# Patient Record
Sex: Female | Born: 1975
Health system: Southern US, Community
[De-identification: ages and names within clinical notes are randomized; demographics above are authoritative.]

## PROBLEM LIST (undated history)

## (undated) DIAGNOSIS — M797 Fibromyalgia: Secondary | ICD-10-CM

## (undated) DIAGNOSIS — Z8739 Personal history of other diseases of the musculoskeletal system and connective tissue: Secondary | ICD-10-CM

## (undated) DIAGNOSIS — Z86018 Personal history of other benign neoplasm: Secondary | ICD-10-CM

## (undated) DIAGNOSIS — M1990 Unspecified osteoarthritis, unspecified site: Secondary | ICD-10-CM

## (undated) DIAGNOSIS — M7752 Other enthesopathy of left foot: Secondary | ICD-10-CM

## (undated) DIAGNOSIS — M349 Systemic sclerosis, unspecified: Secondary | ICD-10-CM

## (undated) DIAGNOSIS — J45909 Unspecified asthma, uncomplicated: Secondary | ICD-10-CM

## (undated) HISTORY — DX: Personal history of other diseases of the musculoskeletal system and connective tissue: Z87.39

## (undated) HISTORY — PX: KNEE SURGERY: SHX244

## (undated) HISTORY — PX: TUBAL LIGATION: SHX77

## (undated) HISTORY — DX: Personal history of other benign neoplasm: Z86.018

## (undated) HISTORY — DX: Unspecified asthma, uncomplicated: J45.909

## (undated) HISTORY — DX: Other enthesopathy of left foot and ankle: M77.52

## (undated) HISTORY — PX: NASAL SEPTUM SURGERY: SHX37

## (undated) HISTORY — PX: MYOMECTOMY: SHX85

## (undated) HISTORY — PX: CARPAL TUNNEL RELEASE: SHX101

## (undated) HISTORY — DX: Unspecified osteoarthritis, unspecified site: M19.90

## (undated) HISTORY — DX: Systemic sclerosis, unspecified: M34.9

## (undated) HISTORY — DX: Fibromyalgia: M79.7

---

## 2001-09-17 HISTORY — PX: ENDOMETRIAL ABLATION: SHX621

## 2010-09-17 HISTORY — PX: LAPAROSCOPIC SUPRACERVICAL HYSTERECTOMY: SUR797

## 2019-07-06 ENCOUNTER — Other Ambulatory Visit: Payer: Self-pay

## 2019-07-06 ENCOUNTER — Emergency Department (HOSPITAL_COMMUNITY)
Admission: EM | Admit: 2019-07-06 | Discharge: 2019-07-06 | Disposition: A | Discharge status: Home / Self Care | Payer: Medicare Other | Attending: Emergency Medicine | Admitting: Emergency Medicine

## 2019-07-06 ENCOUNTER — Encounter (HOSPITAL_COMMUNITY): Payer: Self-pay | Admitting: Emergency Medicine

## 2019-07-06 ENCOUNTER — Emergency Department (HOSPITAL_COMMUNITY): Payer: Medicare Other

## 2019-07-06 DIAGNOSIS — R0789 Other chest pain: Secondary | ICD-10-CM | POA: Diagnosis not present

## 2019-07-06 DIAGNOSIS — R079 Chest pain, unspecified: Secondary | ICD-10-CM | POA: Diagnosis present

## 2019-07-06 DIAGNOSIS — Z79899 Other long term (current) drug therapy: Secondary | ICD-10-CM | POA: Diagnosis not present

## 2019-07-06 LAB — TROPONIN I (HIGH SENSITIVITY)
Troponin I (High Sensitivity): <2 ng/L (ref <18)
Troponin I (High Sensitivity): <2 ng/L (ref <18)

## 2019-07-06 LAB — CBC
HCT: 44.8 % (ref 36.0–46.0)
Hemoglobin: 14.4 g/dL (ref 12.0–15.0)
MCH: 24.8 pg — ABNORMAL LOW (ref 26.0–34.0)
MCHC: 32.1 g/dL (ref 30.0–36.0)
MCV: 77.1 fL — ABNORMAL LOW (ref 80.0–100.0)
Platelets: 278 10*3/uL (ref 150–400)
RBC: 5.81 MIL/uL — ABNORMAL HIGH (ref 3.87–5.11)
RDW: 13.2 % (ref 11.5–15.5)
WBC: 5.3 10*3/uL (ref 4.0–10.5)
nRBC: 0 % (ref 0.0–0.2)

## 2019-07-06 LAB — BASIC METABOLIC PANEL
Anion gap: 8 (ref 5–15)
BUN: 5 mg/dL — ABNORMAL LOW (ref 6–20)
CO2: 26 mmol/L (ref 22–32)
Calcium: 9.2 mg/dL (ref 8.9–10.3)
Chloride: 103 mmol/L (ref 98–111)
Creatinine, Ser: 0.95 mg/dL (ref 0.44–1.00)
GFR calc Af Amer: >60 mL/min (ref >60)
GFR calc non Af Amer: >60 mL/min (ref >60)
Glucose, Bld: 95 mg/dL (ref 70–99)
Potassium: 4.2 mmol/L (ref 3.5–5.1)
Sodium: 137 mmol/L (ref 135–145)

## 2019-07-06 LAB — I-STAT BETA HCG BLOOD, ED (MC, WL, AP ONLY): I-stat hCG, quantitative: <5 m[IU]/mL (ref <5)

## 2019-07-06 NOTE — ED Provider Notes (Signed)
[facility] EMERGENCY DEPARTMENT Provider Note   CSN: [id number] Arrival date & time: 07/06/19  1201     History   Chief Complaint Chief Complaint  Patient presents with  . Chest Pain    HPI Sheri May is a 43 y.o. female.     HPI Patient presents for concern of chest pain, described as tightness, left inferior breast wrapping to the left side. No dyspnea, cough, fever, chills, nausea, vomiting. She does have some similar episodes in the past, though none quite the same. No clear alleviating or exacerbating factors. No syncope. Notably, the patient has no history of cardiac disease.  She does have history of depression, and notes that this time of year she often has episodes, as it is the anniversary of child's death, 23 years ago.   OB History   No obstetric history on file.    Past medical history: Allergies Arthritis   Home Medications    Prior to Admission medications   Medication Sig Start Date End Date Taking? Authorizing Provider  aspirin-acetaminophen-caffeine (EXCEDRIN MIGRAINE) [phone] MG tablet Take 2 tablets by mouth every 6 (six) hours as needed for headache or migraine.   Yes [provider]  budesonide-formoterol (SYMBICORT) 160-4.5 MCG/ACT inhaler Inhale 2 puffs into the lungs 2 (two) times daily.   Yes [provider]  Chlorphen-Pseudoephed-APAP (TYLENOL ALLERGY SINUS PO) Take 2 tablets by mouth every 6 (six) hours as needed (congestion).   Yes [provider]  cyclobenzaprine (FLEXERIL) 5 MG tablet Take 5 mg by mouth 2 (two) times daily as needed for muscle spasms.   Yes [provider]  folic acid (FOLVITE) 1 MG tablet Take 1 mg by mouth every morning.   Yes [provider]  ketotifen (ZADITOR) 0.025 % ophthalmic solution Place 1 drop into both eyes 2 (two) times daily.   Yes [provider]  levalbuterol (XOPENEX HFA) 45 MCG/ACT inhaler Inhale 2 puffs into the lungs every  8 (eight) hours as needed for wheezing or shortness of breath.   Yes [provider]  levocetirizine (XYZAL) 5 MG tablet Take 5 mg by mouth at bedtime.   Yes [provider]  methotrexate (RHEUMATREX) 2.5 MG tablet Take 17.5 mg by mouth every Sunday. 7 tablets - 17.5 mg   Yes [provider]  montelukast (SINGULAIR) 10 MG tablet Take 10 mg by mouth at bedtime.   Yes [provider]  Multiple Vitamin (MULTIVITAMIN WITH MINERALS) TABS tablet Take 1 tablet by mouth every morning.   Yes [provider]  naproxen (NAPROSYN) 500 MG tablet Take 500 mg by mouth 2 (two) times daily as needed (pain).   Yes [provider]  omalizumab Geoffry Paradise) 150 MG/ML prefilled syringe Inject 375 mg into the skin every 14 (fourteen) days.   Yes [provider]    Family History History reviewed. No pertinent family history.  Social History Social History   Tobacco Use  . Smoking status: Not on file  Substance Use Topics  . Alcohol use: Not on file  . Drug use: Not on file     Allergies   Patient has no known allergies.   Review of Systems Review of Systems  Constitutional:       Per HPI, otherwise negative  HENT:       Per HPI, otherwise negative  Respiratory:       Per HPI, otherwise negative  Cardiovascular:       Per HPI, otherwise negative  Gastrointestinal:  Negative for vomiting.  Endocrine:       Negative aside from HPI  Genitourinary:       Neg aside from HPI   Musculoskeletal:       Per HPI, otherwise negative  Skin: Negative.   Neurological: Negative for syncope.  Psychiatric/Behavioral: Positive for dysphoric mood. The patient is nervous/anxious.      Physical Exam Updated Vital Signs BP 113/74   Pulse 68   Temp 98.7 F (37.1 C) (Oral)   Resp 16   SpO2 100%   Physical Exam Vitals signs and nursing note reviewed.  Constitutional:      General: She is not in acute distress.    Appearance: She is  well-developed.  HENT:     Head: Normocephalic and atraumatic.  Eyes:     Conjunctiva/sclera: Conjunctivae normal.  Cardiovascular:     Rate and Rhythm: Normal rate and regular rhythm.  Pulmonary:     Effort: Pulmonary effort is normal. No respiratory distress.     Breath sounds: Normal breath sounds. No stridor.  Abdominal:     General: There is no distension.  Skin:    General: Skin is warm and dry.  Neurological:     Mental Status: She is alert and oriented to person, place, and time.     Cranial Nerves: No cranial nerve deficit.      ED Treatments / Results  Labs (all labs ordered are listed, but only abnormal results are displayed) Labs Reviewed  BASIC METABOLIC PANEL - Abnormal; Notable for the following components:      Result Value   BUN 5 (*)    All other components within normal limits  CBC - Abnormal; Notable for the following components:   RBC 5.81 (*)    MCV 77.1 (*)    MCH 24.8 (*)    All other components within normal limits  I-STAT BETA HCG BLOOD, ED (MC, WL, AP ONLY)  TROPONIN I (HIGH SENSITIVITY)  TROPONIN I (HIGH SENSITIVITY)    EKG EKG Interpretation  Date/Time:  Monday July 06 2019 12:07:52 EDT Ventricular Rate:  93 PR Interval:  136 QRS Duration: 76 QT Interval:  370 QTC Calculation: 460 R Axis:   85 Text Interpretation:  Normal sinus rhythm with sinus arrhythmia Artifact Abnormal ECG Confirmed by Gerhard MunchLockwood, Doreen Garretson [phone]([id number]) on 07/06/2019 12:51:24 PM   Radiology Dg Chest 2 View  Result Date: 07/06/2019 CLINICAL DATA:  Chest pain for 1 week EXAM: CHEST - 2 VIEW COMPARISON:  None. FINDINGS: The heart size and mediastinal contours are within normal limits. Both lungs are clear. The visualized skeletal structures are unremarkable. IMPRESSION: No active cardiopulmonary disease. Electronically Signed   By: Alcide CleverMark  Lukens M.D.   On: 07/06/2019 12:42    Procedures Procedures (including critical care time)  Medications Ordered in ED Medications  - No data to display   Initial Impression / Assessment and Plan / ED Course  I have reviewed the triage vital signs and the nursing notes.  Pertinent labs & imaging results that were available during my care of the patient were reviewed by me and considered in my medical decision making (see chart for details).        3:33 PM Patient in no distress, awake, alert.  We discussed all findings, which were reassuring. No evidence for ACS, pneumonia, PE with reassuring vitals, labs, x-ray. We discussed necessity of follow-up, and the patient notes that she just moved to this area.  She has been provided referral to outpatient  providers.  Covid test pending.   Daisa Stennis was evaluated in Emergency Department on 07/06/2019 for the symptoms described in the history of present illness. She was evaluated in the context of the global COVID-19 pandemic, which necessitated consideration that the patient might be at risk for infection with the SARS-CoV-2 virus that causes COVID-19. Institutional protocols and algorithms that pertain to the evaluation of patients at risk for COVID-19 are in a state of rapid change based on information released by regulatory bodies including the CDC and federal and state organizations. These policies and algorithms were followed during the patient's care in the ED.   Final Clinical Impressions(s) / ED Diagnoses   Final diagnoses:  Atypical chest pain     Carmin Muskrat, MD 07/06/19 1534

## 2019-07-06 NOTE — Discharge Instructions (Addendum)
As discussed, your evaluation today has been largely reassuring.  But, it is important that you monitor your condition carefully, and do not hesitate to return to the ED if you develop new, or concerning changes in your condition. ? ?Otherwise, please follow-up with your physician for appropriate ongoing care. ? ?

## 2019-07-06 NOTE — ED Triage Notes (Signed)
Pt states she has been having chest pain that started last week. Pt thought it was a depressive episode due to the anniversary of her child's death, but it has persisted. Endorses dizziness, weakness, loss of appetite. Pt took tylenol and muscle relaxer's with minimal relief.

## 2019-07-19 DIAGNOSIS — E559 Vitamin D deficiency, unspecified: Secondary | ICD-10-CM

## 2019-07-19 DIAGNOSIS — E785 Hyperlipidemia, unspecified: Secondary | ICD-10-CM

## 2019-07-19 DIAGNOSIS — K219 Gastro-esophageal reflux disease without esophagitis: Secondary | ICD-10-CM

## 2019-07-19 HISTORY — DX: Hyperlipidemia, unspecified: E78.5

## 2019-07-19 HISTORY — DX: Vitamin D deficiency, unspecified: E55.9

## 2019-07-19 HISTORY — DX: Gastro-esophageal reflux disease without esophagitis: K21.9

## 2019-08-03 ENCOUNTER — Other Ambulatory Visit: Payer: Self-pay

## 2019-08-03 ENCOUNTER — Encounter: Payer: Self-pay | Admitting: Family Medicine

## 2019-08-03 ENCOUNTER — Ambulatory Visit (INDEPENDENT_AMBULATORY_CARE_PROVIDER_SITE_OTHER): Payer: Medicare Other | Admitting: Family Medicine

## 2019-08-03 VITALS — BP 127/73 | HR 72 | Temp 98.2°F | Ht 62.4 in | Wt 207.0 lb

## 2019-08-03 DIAGNOSIS — K2100 Gastro-esophageal reflux disease with esophagitis, without bleeding: Secondary | ICD-10-CM

## 2019-08-03 DIAGNOSIS — Z5900 Homelessness unspecified: Secondary | ICD-10-CM

## 2019-08-03 DIAGNOSIS — E559 Vitamin D deficiency, unspecified: Secondary | ICD-10-CM

## 2019-08-03 DIAGNOSIS — Z23 Encounter for immunization: Secondary | ICD-10-CM

## 2019-08-03 DIAGNOSIS — Z7689 Persons encountering health services in other specified circumstances: Secondary | ICD-10-CM

## 2019-08-03 DIAGNOSIS — E538 Deficiency of other specified B group vitamins: Secondary | ICD-10-CM

## 2019-08-03 DIAGNOSIS — R0602 Shortness of breath: Secondary | ICD-10-CM

## 2019-08-03 DIAGNOSIS — Z0000 Encounter for general adult medical examination without abnormal findings: Secondary | ICD-10-CM | POA: Diagnosis not present

## 2019-08-03 DIAGNOSIS — R809 Proteinuria, unspecified: Secondary | ICD-10-CM

## 2019-08-03 DIAGNOSIS — Z09 Encounter for follow-up examination after completed treatment for conditions other than malignant neoplasm: Secondary | ICD-10-CM

## 2019-08-03 DIAGNOSIS — Z6837 Body mass index (BMI) 37.0-37.9, adult: Secondary | ICD-10-CM

## 2019-08-03 DIAGNOSIS — E66812 Obesity, class 2: Secondary | ICD-10-CM

## 2019-08-03 DIAGNOSIS — R0789 Other chest pain: Secondary | ICD-10-CM

## 2019-08-03 DIAGNOSIS — R1084 Generalized abdominal pain: Secondary | ICD-10-CM

## 2019-08-03 LAB — POCT URINALYSIS DIPSTICK
Blood, UA: NEGATIVE
Glucose, UA: NEGATIVE
Ketones, UA: NEGATIVE
Leukocytes, UA: NEGATIVE
Nitrite, UA: NEGATIVE
Protein, UA: POSITIVE — AB
Spec Grav, UA: >=1.03 — AB (ref 1.010–1.025)
Urobilinogen, UA: 0.2 E.U./dL
pH, UA: 5.5 (ref 5.0–8.0)

## 2019-08-03 MED ORDER — NAPROXEN 500 MG PO TABS: 500.0000 mg | ORAL_TABLET | Freq: Two times a day (BID) | ORAL | 3 refills | Status: DC | PRN

## 2019-08-03 MED ORDER — CETIRIZINE HCL 10 MG PO TABS: 10.0000 mg | ORAL_TABLET | Freq: Every day | ORAL | 11 refills | Status: DC

## 2019-08-03 MED ORDER — LEVALBUTEROL TARTRATE 45 MCG/ACT IN AERO: 2.0000 | INHALATION_SPRAY | Freq: Three times a day (TID) | RESPIRATORY_TRACT | 11 refills | Status: DC | PRN

## 2019-08-03 NOTE — Progress Notes (Signed)
[facility]   New Patient--Hospital Follow Up--Establish Care  Subjective:  Patient ID: Sheri May, female    DOB: [birth year]  Age: 43 y.o. MRN: [id number]  CC:  Chief Complaint  Patient presents with  . Hospitalization Follow-up    Seen ED 07/06/2019 Atypical Chest Pain  . Establish Care  . Abdominal Pain    on & off pain under rib across abdomen -     HPI Sheri May is a 43 year old female who presents for Hospital Follow Up and to Establish Care today.   Past Medical History:  Diagnosis Date  . Arthritis   . Asthma   . Fibromyalgia   . H/O scleroderma   . H/O: hysterectomy 2012   "partial"  . History of uterine fibroid    removal 2013  . History of vitamin D deficiency   . Systemic sclerosis (HCC)    Current Status: This will be Ms.Cappuccio's initial office visit with me. She was previously seeing physician in [place], [place] for her PCP needs. Since her last office visit, she has had an ED visit for Atypical Chest Pain on 07/06/2019. Today, she is doing well with no complaints. She reports upper-mid chest discomfort intermittently. Denies chest pain, heart palpitations, cough and shortness of breath reported. She reports occasional nausea. She reports occasional abdominal pain, which she has a history of Fibroid removal. No reports of GI problems such as vomiting, diarrhea, and constipation.    Her anxiety is mild today. She denies suicidal ideations, homicidal ideations, or auditory hallucinations.   She reports that she is currently homeless and does not have a permanent place to live at this time.    She denies fevers, chills, fatigue, recent infections, weight loss, and night sweats. She has not had any headaches, visual changes, dizziness, and falls. She has no reports of blood in stools, dysuria and hematuria. She denies pain today.    Past Surgical History:  Procedure Laterality Date  . ABLATION     uterine   . CARPAL TUNNEL RELEASE Bilateral   . CESAREAN SECTION  1997  . KNEE SURGERY Left    plate in left knee  . MYOMECTOMY    . NASAL SEPTUM SURGERY      Family History  Problem Relation Age of Onset  . Hypertension Mother   . Fibromyalgia Mother   . Hypertension Father   . Multiple sclerosis Brother     Social History   Socioeconomic History  . Marital status: Single    Spouse name: Not on file  . Number of children: Not on file  . Years of education: Not on file  . Highest education level: Not on file  Occupational History  . Not on file  Social Needs  . Financial resource strain: Not on file  . Food insecurity    Worry: Not on file    Inability: Not on file  . Transportation needs    Medical: Not on file    Non-medical: Not on file  Tobacco Use  . Smoking status: Light Tobacco Smoker  . Smokeless tobacco: Never Used  Substance and Sexual Activity  . Alcohol use: Not Currently  . Drug use: Not Currently  . Sexual activity: Yes    Partners: Male    Birth control/protection: Condom  Lifestyle  . Physical activity    Days per week: Not on file    Minutes per session: Not on file  . Stress: Not on  file  Relationships  . Social Musicianconnections    Talks on phone: Not on file    Gets together: Not on file    Attends religious service: Not on file    Active member of club or organization: Not on file    Attends meetings of clubs or organizations: Not on file    Relationship status: Not on file  . Intimate partner violence    Fear of current or ex partner: Not on file    Emotionally abused: Not on file    Physically abused: Not on file    Forced sexual activity: Not on file  Other Topics Concern  . Not on file  Social History Narrative  . Not on file    Outpatient Medications Prior to Visit  Medication Sig Dispense Refill  . aspirin-acetaminophen-caffeine (EXCEDRIN MIGRAINE) 250-250-65 MG tablet Take 2 tablets by mouth every 6 (six) hours as needed for headache  or migraine.    . budesonide-formoterol (SYMBICORT) 160-4.5 MCG/ACT inhaler Inhale 2 puffs into the lungs 2 (two) times daily.    . Chlorphen-Pseudoephed-APAP (TYLENOL ALLERGY SINUS PO) Take 2 tablets by mouth every 6 (six) hours as needed (congestion).    . folic acid (FOLVITE) 1 MG tablet Take 1 mg by mouth every morning.    Marland Kitchen. ketotifen (ZADITOR) 0.025 % ophthalmic solution Place 1 drop into both eyes 2 (two) times daily.    . montelukast (SINGULAIR) 10 MG tablet Take 10 mg by mouth at bedtime.    . Multiple Vitamin (MULTIVITAMIN WITH MINERALS) TABS tablet Take 1 tablet by mouth every morning.    Marland Kitchen. omalizumab Geoffry Paradise(XOLAIR) 150 MG/ML prefilled syringe Inject 375 mg into the skin every 14 (fourteen) days.    . naproxen (NAPROSYN) 500 MG tablet Take 500 mg by mouth 2 (two) times daily as needed (pain).    . cyclobenzaprine (FLEXERIL) 5 MG tablet Take 5 mg by mouth 2 (two) times daily as needed for muscle spasms.    . methotrexate (RHEUMATREX) 2.5 MG tablet Take 17.5 mg by mouth every Sunday. 7 tablets - 17.5 mg    . levalbuterol (XOPENEX HFA) 45 MCG/ACT inhaler Inhale 2 puffs into the lungs every 8 (eight) hours as needed for wheezing or shortness of breath.    . levocetirizine (XYZAL) 5 MG tablet Take 5 mg by mouth at bedtime.     No facility-administered medications prior to visit.     No Known Allergies  ROS Review of Systems  Constitutional: Negative.   HENT: Negative.   Eyes: Negative.   Respiratory: Negative.   Cardiovascular: Negative.   Gastrointestinal: Positive for abdominal distention and abdominal pain (generalized).  Endocrine: Negative.   Genitourinary: Negative.   Musculoskeletal: Positive for arthralgias (gneralized).  Skin: Negative.   Allergic/Immunologic: Negative.   Neurological: Positive for dizziness (occasional ) and headaches (Occasional ).  Hematological: Negative.   Psychiatric/Behavioral: Negative.       Objective:    Physical Exam  Constitutional: She is  oriented to person, place, and time. She appears well-developed and well-nourished.  HENT:  Head: Normocephalic and atraumatic.  Eyes: Conjunctivae are normal.  Neck: Normal range of motion. Neck supple.  Cardiovascular: Normal rate, regular rhythm, normal heart sounds and intact distal pulses.  Pulmonary/Chest: Effort normal and breath sounds normal.  Abdominal: Soft. Bowel sounds are normal.  Musculoskeletal: Normal range of motion.  Neurological: She is alert and oriented to person, place, and time. She has normal reflexes.  Skin: Skin is warm and dry.  Psychiatric: She has a normal mood and affect. Her behavior is normal. Judgment and thought content normal.  Nursing note and vitals reviewed.   BP 127/73   Pulse 72   Temp 98.2 F (36.8 C)   Ht 5' 2.4" (1.585 m)   Wt 207 lb (93.9 kg)   SpO2 98%   BMI 37.38 kg/m  Wt Readings from Last 3 Encounters:  08/03/19 207 lb (93.9 kg)     Health Maintenance Due  Topic Date Due  . HIV Screening  12/24/1991  . TETANUS/TDAP  12/24/1995  . PAP SMEAR-Modifier  12/23/1997    There are no preventive care reminders to display for this patient.  No results found for: TSH Lab Results  Component Value Date   WBC 5.3 07/06/2019   HGB 14.4 07/06/2019   HCT 44.8 07/06/2019   MCV 77.1 (L) 07/06/2019   PLT 278 07/06/2019   Lab Results  Component Value Date   NA 137 07/06/2019   K 4.2 07/06/2019   CO2 26 07/06/2019   GLUCOSE 95 07/06/2019   BUN 5 (L) 07/06/2019   CREATININE 0.95 07/06/2019   CALCIUM 9.2 07/06/2019   ANIONGAP 8 07/06/2019   No results found for: CHOL No results found for: HDL No results found for: LDLCALC No results found for: TRIG No results found for: CHOLHDL No results found for: HGBA1C    Assessment & Plan:   1. Hospital discharge follow-up  2. Encounter to establish care  3. Atypical chest pain Stable today. We will assess for H.Pylori Infection today.   4. Homeless single person She is  currently searching for a place to live. We will refer her to Estanislado Emms, LCSW at our office for further housing resources.   5. Healthcare maintenance - Urinalysis Dipstick - Hepatic Function Panel - HepB+HepC+HIV Panel - TSH - Lipid Panel  6. Proteinuria, unspecified type  7. Gastroesophageal reflux disease with esophagitis without hemorrhage - H. pylori Screen - cetirizine (ZYRTEC) 10 MG tablet; Take 1 tablet (10 mg total) by mouth daily.  Dispense: 30 tablet; Refill: 11 - H. pylori breath test  8. Shortness of breath - levalbutserol (XOPENEX HFA) 45 MCG/ACT inhaler; Inhale 2 puffs into the lungs every 8 (eight) hours as needed for wheezing or shortness of breath.  Dispense: 1 Inhaler; Refill: 11  9. Generalized abdominal cramps - naproxen (NAPROSYN) 500 MG tablet; Take 1 tablet (500 mg total) by mouth 2 (two) times daily as needed (pain).  Dispense: 60 tablet; Refill: 3  10. Vitamin D deficiency - Vitamin D, 25-hydroxy  11. Vitamin B12 deficiency - Vitamin B12  12. Class 2 severe obesity due to excess calories with serious comorbidity and body mass index (BMI) of 37.0 to 37.9 in adult [facility]) Body mass index is 37.38 kg/m.  Goal BMI  is <30. Encouraged efforts to reduce weight include engaging in physical activity as tolerated with goal of 150 minutes per week. Improve dietary choices and eat a meal regimen consistent with a Mediterranean or DASH diet. Reduce simple carbohydrates. Do not skip meals and eat healthy snacks throughout the day to avoid over-eating at dinner. Set a goal weight loss that is achievable for you.  13. Follow up She will follow up in 1-2 months.   Meds ordered this encounter  Medications  . cetirizine (ZYRTEC) 10 MG tablet    Sig: Take 1 tablet (10 mg total) by mouth daily.    Dispense:  30 tablet    Refill:  11  .  naproxen (NAPROSYN) 500 MG tablet    Sig: Take 1 tablet (500 mg total) by mouth 2 (two) times daily as needed (pain).    Dispense:   60 tablet    Refill:  3  . levalbuterol (XOPENEX HFA) 45 MCG/ACT inhaler    Sig: Inhale 2 puffs into the lungs every 8 (eight) hours as needed for wheezing or shortness of breath.    Dispense:  1 Inhaler    Refill:  11    Orders Placed This Encounter  Procedures  . Flu Vaccine QUAD 6+ mos PF IM (Fluarix Quad PF)  . Hepatic Function Panel  . HepB+HepC+HIV Panel  . TSH  . Lipid Panel  . Vitamin B12  . Vitamin D, 25-hydroxy  . H. pylori breath test  . Urinalysis Dipstick  . H. pylori Screen   Referral Orders  No referral(s) requested today    Raliegh Ip,  MSN, FNP-BC [facility] [address] [place], [place] [address] [phone] fax  Problem List Items Addressed This Visit    None    Visit Diagnoses    Hospital discharge follow-up    -  Primary   Encounter to establish care       Atypical chest pain       Homeless single person       Healthcare maintenance       Relevant Orders   Urinalysis Dipstick (Completed)   Hepatic Function Panel   HepB+HepC+HIV Panel   TSH   Lipid Panel   Proteinuria, unspecified type       Gastroesophageal reflux disease with esophagitis without hemorrhage       Relevant Medications   cetirizine (ZYRTEC) 10 MG tablet   Other Relevant Orders   H. pylori Screen   H. pylori breath test   Shortness of breath       Relevant Medications   levalbuterol (XOPENEX HFA) 45 MCG/ACT inhaler   Generalized abdominal cramps       Relevant Medications   naproxen (NAPROSYN) 500 MG tablet   Vitamin D deficiency       Relevant Orders   Vitamin D, 25-hydroxy   Vitamin B12 deficiency       Relevant Orders   Vitamin B12   Class 2 severe obesity due to excess calories with serious comorbidity and body mass index (BMI) of 37.0 to 37.9 in adult [facility])       Follow up          Meds ordered this encounter  Medications  . cetirizine (ZYRTEC) 10 MG tablet    Sig:  Take 1 tablet (10 mg total) by mouth daily.    Dispense:  30 tablet    Refill:  11  . naproxen (NAPROSYN) 500 MG tablet    Sig: Take 1 tablet (500 mg total) by mouth 2 (two) times daily as needed (pain).    Dispense:  60 tablet    Refill:  3  . levalbuterol (XOPENEX HFA) 45 MCG/ACT inhaler    Sig: Inhale 2 puffs into the lungs every 8 (eight) hours as needed for wheezing or shortness of breath.    Dispense:  1 Inhaler    Refill:  11    Follow-up: No follow-ups on file.    Kallie Locks, FNP

## 2019-08-04 ENCOUNTER — Other Ambulatory Visit: Payer: Self-pay | Admitting: Family Medicine

## 2019-08-04 ENCOUNTER — Encounter: Payer: Self-pay | Admitting: Family Medicine

## 2019-08-04 DIAGNOSIS — E559 Vitamin D deficiency, unspecified: Secondary | ICD-10-CM | POA: Insufficient documentation

## 2019-08-04 DIAGNOSIS — Z5900 Homelessness unspecified: Secondary | ICD-10-CM | POA: Insufficient documentation

## 2019-08-04 DIAGNOSIS — Z6837 Body mass index (BMI) 37.0-37.9, adult: Secondary | ICD-10-CM | POA: Insufficient documentation

## 2019-08-04 DIAGNOSIS — R809 Proteinuria, unspecified: Secondary | ICD-10-CM | POA: Insufficient documentation

## 2019-08-04 DIAGNOSIS — R0602 Shortness of breath: Secondary | ICD-10-CM | POA: Insufficient documentation

## 2019-08-04 DIAGNOSIS — E538 Deficiency of other specified B group vitamins: Secondary | ICD-10-CM | POA: Insufficient documentation

## 2019-08-04 DIAGNOSIS — E66812 Obesity, class 2: Secondary | ICD-10-CM | POA: Insufficient documentation

## 2019-08-04 DIAGNOSIS — K2100 Gastro-esophageal reflux disease with esophagitis, without bleeding: Secondary | ICD-10-CM | POA: Insufficient documentation

## 2019-08-04 DIAGNOSIS — E782 Mixed hyperlipidemia: Secondary | ICD-10-CM

## 2019-08-04 DIAGNOSIS — R1084 Generalized abdominal pain: Secondary | ICD-10-CM | POA: Insufficient documentation

## 2019-08-04 LAB — H. PYLORI BREATH TEST: H pylori Breath Test: NEGATIVE

## 2019-08-05 LAB — LIPID PANEL
Chol/HDL Ratio: 4.6 ratio — ABNORMAL HIGH (ref 0.0–4.4)
Cholesterol, Total: 312 mg/dL — ABNORMAL HIGH (ref 100–199)
HDL: 68 mg/dL (ref >39)
LDL Chol Calc (NIH): 216 mg/dL — ABNORMAL HIGH (ref 0–99)
Triglycerides: 151 mg/dL — ABNORMAL HIGH (ref 0–149)
VLDL Cholesterol Cal: 28 mg/dL (ref 5–40)

## 2019-08-05 LAB — HEPATIC FUNCTION PANEL
ALT: 13 IU/L (ref 0–32)
AST: 16 IU/L (ref 0–40)
Albumin: 4.8 g/dL (ref 3.8–4.8)
Alkaline Phosphatase: 75 IU/L (ref 39–117)
Bilirubin Total: 0.3 mg/dL (ref 0.0–1.2)
Bilirubin, Direct: 0.1 mg/dL (ref 0.00–0.40)
Total Protein: 7.5 g/dL (ref 6.0–8.5)

## 2019-08-05 LAB — HEPB+HEPC+HIV PANEL
HIV Screen 4th Generation wRfx: NONREACTIVE
Hep B C IgM: NEGATIVE
Hep B Core Total Ab: NEGATIVE
Hep B E Ab: NEGATIVE
Hep B E Ag: NEGATIVE
Hep B Surface Ab, Qual: NONREACTIVE
Hep C Virus Ab: <0.1 s/co ratio (ref 0.0–0.9)
Hepatitis B Surface Ag: NEGATIVE

## 2019-08-05 LAB — VITAMIN B12: Vitamin B-12: 316 pg/mL (ref 232–1245)

## 2019-08-05 LAB — VITAMIN D 25 HYDROXY (VIT D DEFICIENCY, FRACTURES): Vit D, 25-Hydroxy: 13.4 ng/mL — ABNORMAL LOW (ref 30.0–100.0)

## 2019-08-05 LAB — TSH: TSH: 1.13 u[IU]/mL (ref 0.450–4.500)

## 2019-08-06 ENCOUNTER — Telehealth: Payer: Self-pay | Admitting: Clinical

## 2019-08-06 ENCOUNTER — Other Ambulatory Visit: Payer: Self-pay | Admitting: Family Medicine

## 2019-08-06 ENCOUNTER — Encounter: Payer: Self-pay | Admitting: Family Medicine

## 2019-08-06 DIAGNOSIS — E559 Vitamin D deficiency, unspecified: Secondary | ICD-10-CM

## 2019-08-06 DIAGNOSIS — K2100 Gastro-esophageal reflux disease with esophagitis, without bleeding: Secondary | ICD-10-CM

## 2019-08-06 MED ORDER — FAMOTIDINE 20 MG PO TABS: 20.0000 mg | ORAL_TABLET | Freq: Every day | ORAL | 3 refills | Status: DC

## 2019-08-06 MED ORDER — VITAMIN D (ERGOCALCIFEROL) 1.25 MG (50000 UNIT) PO CAPS: 50000.0000 [IU] | ORAL_CAPSULE | ORAL | 6 refills | Status: DC

## 2019-08-06 NOTE — Telephone Encounter (Signed)
[facility] Referral Note  Reason for Referral: Sheri May is a 44 y.o. female  Pt was referred by NP, Kathe Becton for: housing   Pt reports the following concerns: does not have a stable place to live, just moved to [place]: Patient recently moved to [place] after her landlord in [place] ended her tenancy. Patient staying with her daughter here in [place] on and off when possible. Also sleeps outside at times. Has a Section 8 voucher administered through [employer] and working with case worker to have it switched to [place]. Needs assistance searching for housing that will accept her voucher.    Plan: 1. Addressed today: Reviewed patient's housing voucher allowance and needs. Discussed housing and community resources with patient over the phone. Confirmed patient has internet access and emailed patient housing search list and additional housing resources. Advised patient to contact CSW with additional needs or questions.  2. Referral: [employer], [employer], [employer]  3. Follow up: as needed  Estanislado Emms, [facility] [phone]

## 2019-08-30 ENCOUNTER — Emergency Department (HOSPITAL_COMMUNITY)
Admission: EM | Admit: 2019-08-30 | Discharge: 2019-08-30 | Disposition: A | Discharge status: Home / Self Care | Payer: Medicare Other | Attending: Emergency Medicine | Admitting: Emergency Medicine

## 2019-08-30 ENCOUNTER — Encounter (HOSPITAL_COMMUNITY): Payer: Self-pay | Admitting: Emergency Medicine

## 2019-08-30 ENCOUNTER — Emergency Department (HOSPITAL_COMMUNITY): Payer: Medicare Other

## 2019-08-30 ENCOUNTER — Other Ambulatory Visit: Payer: Self-pay

## 2019-08-30 DIAGNOSIS — J45909 Unspecified asthma, uncomplicated: Secondary | ICD-10-CM | POA: Diagnosis not present

## 2019-08-30 DIAGNOSIS — F17200 Nicotine dependence, unspecified, uncomplicated: Secondary | ICD-10-CM | POA: Insufficient documentation

## 2019-08-30 DIAGNOSIS — R109 Unspecified abdominal pain: Secondary | ICD-10-CM | POA: Diagnosis present

## 2019-08-30 DIAGNOSIS — R1031 Right lower quadrant pain: Secondary | ICD-10-CM | POA: Insufficient documentation

## 2019-08-30 DIAGNOSIS — N949 Unspecified condition associated with female genital organs and menstrual cycle: Secondary | ICD-10-CM

## 2019-08-30 DIAGNOSIS — R1011 Right upper quadrant pain: Secondary | ICD-10-CM | POA: Insufficient documentation

## 2019-08-30 DIAGNOSIS — N83209 Unspecified ovarian cyst, unspecified side: Secondary | ICD-10-CM | POA: Diagnosis not present

## 2019-08-30 DIAGNOSIS — Z79899 Other long term (current) drug therapy: Secondary | ICD-10-CM | POA: Diagnosis not present

## 2019-08-30 LAB — COMPREHENSIVE METABOLIC PANEL
ALT: 11 U/L (ref 0–44)
AST: 16 U/L (ref 15–41)
Albumin: 4 g/dL (ref 3.5–5.0)
Alkaline Phosphatase: 59 U/L (ref 38–126)
Anion gap: 8 (ref 5–15)
BUN: 8 mg/dL (ref 6–20)
CO2: 26 mmol/L (ref 22–32)
Calcium: 9.1 mg/dL (ref 8.9–10.3)
Chloride: 105 mmol/L (ref 98–111)
Creatinine, Ser: 0.82 mg/dL (ref 0.44–1.00)
GFR calc Af Amer: >60 mL/min (ref >60)
GFR calc non Af Amer: >60 mL/min (ref >60)
Glucose, Bld: 93 mg/dL (ref 70–99)
Potassium: 3.9 mmol/L (ref 3.5–5.1)
Sodium: 139 mmol/L (ref 135–145)
Total Bilirubin: 0.7 mg/dL (ref 0.3–1.2)
Total Protein: 7.1 g/dL (ref 6.5–8.1)

## 2019-08-30 LAB — I-STAT BETA HCG BLOOD, ED (MC, WL, AP ONLY): I-stat hCG, quantitative: <5 m[IU]/mL (ref <5)

## 2019-08-30 LAB — URINALYSIS, ROUTINE W REFLEX MICROSCOPIC
Bilirubin Urine: NEGATIVE
Glucose, UA: NEGATIVE mg/dL
Ketones, ur: NEGATIVE mg/dL
Leukocytes,Ua: NEGATIVE
Nitrite: NEGATIVE
Protein, ur: NEGATIVE mg/dL
RBC / HPF: >50 RBC/hpf — ABNORMAL HIGH (ref 0–5)
Specific Gravity, Urine: 1.023 (ref 1.005–1.030)
pH: 5 (ref 5.0–8.0)

## 2019-08-30 LAB — LIPASE, BLOOD: Lipase: 24 U/L (ref 11–51)

## 2019-08-30 LAB — CBC
HCT: 41.7 % (ref 36.0–46.0)
Hemoglobin: 13.2 g/dL (ref 12.0–15.0)
MCH: 24.4 pg — ABNORMAL LOW (ref 26.0–34.0)
MCHC: 31.7 g/dL (ref 30.0–36.0)
MCV: 77.2 fL — ABNORMAL LOW (ref 80.0–100.0)
Platelets: 313 10*3/uL (ref 150–400)
RBC: 5.4 MIL/uL — ABNORMAL HIGH (ref 3.87–5.11)
RDW: 13.8 % (ref 11.5–15.5)
WBC: 5.6 10*3/uL (ref 4.0–10.5)
nRBC: 0 % (ref 0.0–0.2)

## 2019-08-30 MED ORDER — SODIUM CHLORIDE 0.9% FLUSH: 3.0000 mL | Freq: Once | INTRAVENOUS | Status: DC

## 2019-08-30 MED ORDER — DICYCLOMINE HCL 20 MG PO TABS: 20.0000 mg | ORAL_TABLET | Freq: Two times a day (BID) | ORAL | 0 refills | Status: DC | PRN

## 2019-08-30 MED ORDER — IOHEXOL 300 MG/ML  SOLN
100.0000 mL | Freq: Once | INTRAMUSCULAR | Status: AC | PRN
  Administered 2019-08-30: 100 mL via INTRAVENOUS

## 2019-08-30 MED ORDER — MORPHINE SULFATE (PF) 4 MG/ML IV SOLN
4.0000 mg | Freq: Once | INTRAVENOUS | Status: AC
  Administered 2019-08-30: 4 mg via INTRAVENOUS
  Filled 2019-08-30: qty 1

## 2019-08-30 NOTE — ED Provider Notes (Signed)
Physical Exam  BP (!) 107/58 (BP Location: Left Arm)   Pulse 76   Temp 98.9 F (37.2 C) (Oral)   Resp 17   SpO2 100%   Physical Exam Gen: appears nontoxic Abd: soft nonrigid   ED Course/Procedures     Procedures  MDM   Pt signed out to me by A Law, PA-C. Please see previous notes for further history.   Pt presenting for evaluation of several wks of abd pain. Pain begins epigastric and radiates to the sides. Pain then goes to the lower abd/vagina, described as a pressure. ?biliary colic. CT pending due to lower abd pain. If normal, can be d/c. No n/v/d. No fevers chills. Labs reassuring.   Ct negative for acute/emergent findings. Shows 5 cm cyst, ?cause of pressure/discomfort.  Discussed with pt. On reassessment, pt remains nontoxic. Discussed sx treatment with bentyl. Discussed f/u with PCP/GI. At this time, pt appears safe for d/c. Return precautions given. Pt states she understands and agrees to plan.   Results for orders placed or performed during the hospital encounter of 08/30/19  Lipase, blood  Result Value Ref Range   Lipase 24 11 - 51 U/L  Comprehensive metabolic panel  Result Value Ref Range   Sodium 139 135 - 145 mmol/L   Potassium 3.9 3.5 - 5.1 mmol/L   Chloride 105 98 - 111 mmol/L   CO2 26 22 - 32 mmol/L   Glucose, Bld 93 70 - 99 mg/dL   BUN 8 6 - 20 mg/dL   Creatinine, Ser 1.61 0.44 - 1.00 mg/dL   Calcium 9.1 8.9 - 09.6 mg/dL   Total Protein 7.1 6.5 - 8.1 g/dL   Albumin 4.0 3.5 - 5.0 g/dL   AST 16 15 - 41 U/L   ALT 11 0 - 44 U/L   Alkaline Phosphatase 59 38 - 126 U/L   Total Bilirubin 0.7 0.3 - 1.2 mg/dL   GFR calc non Af Amer >60 >60 mL/min   GFR calc Af Amer >60 >60 mL/min   Anion gap 8 5 - 15  CBC  Result Value Ref Range   WBC 5.6 4.0 - 10.5 K/uL   RBC 5.40 (H) 3.87 - 5.11 MIL/uL   Hemoglobin 13.2 12.0 - 15.0 g/dL   HCT 04.5 40.9 - 81.1 %   MCV 77.2 (L) 80.0 - 100.0 fL   MCH 24.4 (L) 26.0 - 34.0 pg   MCHC 31.7 30.0 - 36.0 g/dL   RDW 91.4  78.2 - 95.6 %   Platelets 313 150 - 400 K/uL   nRBC 0.0 0.0 - 0.2 %  Urinalysis, Routine w reflex microscopic  Result Value Ref Range   Color, Urine YELLOW YELLOW   APPearance HAZY (A) CLEAR   Specific Gravity, Urine 1.023 1.005 - 1.030   pH 5.0 5.0 - 8.0   Glucose, UA NEGATIVE NEGATIVE mg/dL   Hgb urine dipstick LARGE (A) NEGATIVE   Bilirubin Urine NEGATIVE NEGATIVE   Ketones, ur NEGATIVE NEGATIVE mg/dL   Protein, ur NEGATIVE NEGATIVE mg/dL   Nitrite NEGATIVE NEGATIVE   Leukocytes,Ua NEGATIVE NEGATIVE   RBC / HPF >50 (H) 0 - 5 RBC/hpf   WBC, UA 6-10 0 - 5 WBC/hpf   Bacteria, UA RARE (A) NONE SEEN   Squamous Epithelial / LPF 0-5 0 - 5   Mucus PRESENT   I-Stat beta hCG blood, ED  Result Value Ref Range   I-stat hCG, quantitative <5.0 <5 mIU/mL   Comment 3  CT ABDOMEN PELVIS W CONTRAST  Result Date: 08/30/2019 CLINICAL DATA:  Acute right-sided abdominal pain EXAM: CT ABDOMEN AND PELVIS WITH CONTRAST TECHNIQUE: Multidetector CT imaging of the abdomen and pelvis was performed using the standard protocol following bolus administration of intravenous contrast. CONTRAST:  173mL OMNIPAQUE IOHEXOL 300 MG/ML  SOLN COMPARISON:  None. FINDINGS: Lower chest: No acute abnormality. Hepatobiliary: No focal liver abnormality is seen. No gallstones, gallbladder wall thickening, or biliary dilatation. Pancreas: Unremarkable. No pancreatic ductal dilatation or surrounding inflammatory changes. Spleen: Normal in size without focal abnormality. Adrenals/Urinary Tract: The adrenal glands are within normal limits bilaterally. Kidneys are well visualized bilaterally without renal calculi or obstructive change. The bladder is partially distended. Stomach/Bowel: Scattered mild diverticular change of the colon is noted. No findings to suggest diverticulitis are seen. The appendix is well visualized and within normal limits. No small bowel abnormality is seen. The stomach is within normal limits.  Vascular/Lymphatic: No significant vascular findings are present. No enlarged abdominal or pelvic lymph nodes. Reproductive: The uterus is retroflexed dominant 5 cm cyst is noted in the right adnexa. Other: No abdominal wall hernia or abnormality. No abdominopelvic ascites. Musculoskeletal: Degenerative changes of lumbar spine are noted. IMPRESSION: Right adnexal cyst.  No associated inflammatory changes seen. No other focal abnormality is noted. Electronically Signed   By: Inez Catalina M.D.   On: 08/30/2019 15:41      Franchot Heidelberg, PA-C 08/30/19 1710    Tegeler, Gwenyth Allegra, MD 08/31/19 0003

## 2019-08-30 NOTE — Discharge Instructions (Addendum)
Continue taking home medications as prescribed. Take Bentyl as needed for abdominal pain or spasm. Make sure stay well-hydrated water. Make sure you are having regular bowel movements.  If not, you may take MiraLAX to help encourage bowel movements. Follow-up with your primary care doctor and/or the stomach doctor for further evaluation of your symptoms. Return to the emergency room if you develop high fevers, severe worsening pain, persistent vomiting, any new, worsening, or concerning symptoms.

## 2019-08-30 NOTE — ED Notes (Signed)
Patient verbalizes understanding of discharge instructions. Opportunity for questioning and answers were provided. Armband removed by staff, pt discharged from ED.  

## 2019-08-30 NOTE — ED Triage Notes (Signed)
Pt reports she feels like she is having a "muscle spasm" under her R ribs, she states it feels like pressure is building in that area and causing pressure on her kidneys and in her lower abdomen. States pain comes and goes x2. Denies n/v/d, fevers chills. No urinary symptoms.

## 2019-08-30 NOTE — ED Provider Notes (Signed)
[name] Provider Note   CSN: [id number] Arrival date & time: 08/30/19  1202     History Chief Complaint  Patient presents with  . Abdominal Pain    Sheri May is a 43 y.o. female with history of asthma, fibromyalgia, GERD, scleroderma who presents with a few week history of intermittent abdominal pain.  Patient reports it starts in her epigastric region and ultimately radiates down to her pelvis.  She reports it feels like a spasm.  She reports sometimes it happens after eating, other times it does not.  She denies any nausea, vomiting, diarrhea, fevers, urinary symptoms, abnormal vaginal bleeding or discharge.  Patient has had some soreness in her low back on the right side.  She has tried taking her home naproxen as well as Pepcid and Flexeril without relief.  HPI     Past Medical History:  Diagnosis Date  . Arthritis   . Asthma   . Fibromyalgia   . GERD without esophagitis 07/2019  . H/O scleroderma   . H/O: hysterectomy 2012   "partial"  . History of uterine fibroid    removal 2013  . History of vitamin D deficiency   . Hyperlipidemia 07/2019  . Systemic sclerosis ([place])   . Vitamin D deficiency 07/2019  . Vitamin D deficiency 07/2019    Patient Active Problem List   Diagnosis Date Noted  . Homeless single person 08/04/2019  . Proteinuria 08/04/2019  . Gastroesophageal reflux disease with esophagitis without hemorrhage 08/04/2019  . Shortness of breath 08/04/2019  . Vitamin D deficiency 08/04/2019  . Vitamin B12 deficiency 08/04/2019  . Class 2 severe obesity due to excess calories with serious comorbidity and body mass index (BMI) of 37.0 to 37.9 in adult [facility]) 08/04/2019  . Generalized abdominal cramps 08/04/2019    Past Surgical History:  Procedure Laterality Date  . ABLATION     uterine  . CARPAL TUNNEL RELEASE Bilateral   . CESAREAN SECTION  1997  . KNEE SURGERY Left    plate in left knee  . MYOMECTOMY      . NASAL SEPTUM SURGERY       OB History    Gravida  3   Para      Term      Preterm      AB      Living  1     SAB      TAB      Ectopic      Multiple      Live Births              Family History  Problem Relation Age of Onset  . Hypertension Mother   . Fibromyalgia Mother   . Hypertension Father   . Multiple sclerosis Brother     Social History   Tobacco Use  . Smoking status: Light Tobacco Smoker  . Smokeless tobacco: Never Used  Substance Use Topics  . Alcohol use: Not Currently  . Drug use: Not Currently    Home Medications Prior to Admission medications   Medication Sig Start Date End Date Taking? Authorizing Provider  albuterol (VENTOLIN HFA) 108 (90 Base) MCG/ACT inhaler Inhale 1-2 puffs into the lungs every 4 (four) hours as needed.   Yes [provider]  aspirin-acetaminophen-caffeine (EXCEDRIN MIGRAINE) [phone] MG tablet Take 2 tablets by mouth every 6 (six) hours as needed for headache or migraine.   Yes [provider]  budesonide-formoterol (SYMBICORT) 160-4.5 MCG/ACT inhaler Inhale  2 puffs into the lungs 2 (two) times daily.   Yes [provider]  cetirizine (ZYRTEC) 10 MG tablet Take 1 tablet (10 mg total) by mouth daily. 08/03/19  Yes Kallie LocksStroud, Natalie M, FNP  cyclobenzaprine (FLEXERIL) 10 MG tablet Take 10 mg by mouth 2 (two) times daily as needed for spasms. 05/31/19  Yes [provider]  famotidine (PEPCID) 20 MG tablet Take 1 tablet (20 mg total) by mouth daily. 08/06/19  Yes Kallie LocksStroud, Natalie M, FNP  folic acid (FOLVITE) 1 MG tablet Take 1 mg by mouth every morning.   Yes [provider]  levalbuterol (XOPENEX HFA) 45 MCG/ACT inhaler Inhale 2 puffs into the lungs every 8 (eight) hours as needed for wheezing or shortness of breath. 08/03/19  Yes Kallie LocksStroud, Natalie M, FNP  methotrexate (RHEUMATREX) 2.5 MG tablet Take 17.5 mg by mouth every Sunday. 7 tablets - 17.5 mg   Yes [provider]  montelukast (SINGULAIR) 10 MG tablet Take 10 mg by mouth at bedtime.   Yes [provider]  Multiple Vitamin (MULTIVITAMIN) capsule Take 1 capsule by mouth daily.   Yes [provider]  mupirocin ointment (BACTROBAN) 2 % Apply 1 application topically 3 (three) times daily. 05/28/19  Yes [provider]  naproxen (NAPROSYN) 500 MG tablet Take 1 tablet (500 mg total) by mouth 2 (two) times daily as needed (pain). 08/03/19  Yes Kallie LocksStroud, Natalie M, FNP  Olopatadine HCl 0.2 % SOLN Place 1-2 drops into both eyes daily. 06/19/16  Yes [provider]  omalizumab Geoffry Paradise(XOLAIR) 150 MG/ML prefilled syringe Inject 375 mg into the skin every 14 (fourteen) days.   Yes [provider]  Vitamin D, Ergocalciferol, (DRISDOL) 1.25 MG (50000 UT) CAPS capsule Take 1 capsule (50,000 Units total) by mouth every 7 (seven) days. 08/06/19  Yes Kallie LocksStroud, Natalie M, FNP    Allergies    Ace inhibitors  Review of Systems   Review of Systems  Constitutional: Negative for chills and fever.  HENT: Negative for facial swelling and sore throat.   Respiratory: Negative for shortness of breath.   Cardiovascular: Negative for chest pain.  Gastrointestinal: Positive for abdominal pain. Negative for nausea and vomiting.  Genitourinary: Negative for dysuria.  Musculoskeletal: Negative for back pain.  Skin: Negative for rash and wound.  Neurological: Negative for headaches.  Psychiatric/Behavioral: The patient is not nervous/anxious.     Physical Exam Updated Vital Signs BP (!) 107/58 (BP Location: Left Arm)   Pulse 76   Temp 98.9 F (37.2 C) (Oral)   Resp 17   SpO2 100%   Physical Exam Vitals and nursing note reviewed.  Constitutional:      General: She is not in acute distress.    Appearance: She is well-developed. She is not diaphoretic.  HENT:     Head: Normocephalic and atraumatic.     Mouth/Throat:     Pharynx: No oropharyngeal exudate.  Eyes:     General: No scleral  icterus.       Right eye: No discharge.        Left eye: No discharge.     Conjunctiva/sclera: Conjunctivae normal.     Pupils: Pupils are equal, round, and reactive to light.  Neck:     Thyroid: No thyromegaly.  Cardiovascular:     Rate and Rhythm: Normal rate and regular rhythm.     Heart sounds: Normal heart sounds. No murmur. No friction rub. No gallop.   Pulmonary:     Effort: Pulmonary  effort is normal. No respiratory distress.     Breath sounds: Normal breath sounds. No stridor. No wheezing or rales.  Abdominal:     General: Bowel sounds are normal. There is no distension.     Palpations: Abdomen is soft.     Tenderness: There is abdominal tenderness in the right upper quadrant, right lower quadrant and epigastric area. There is no right CVA tenderness, left CVA tenderness, guarding or rebound. Positive signs include Murphy's sign and McBurney's sign.  Musculoskeletal:     Cervical back: Normal range of motion and neck supple.  Lymphadenopathy:     Cervical: No cervical adenopathy.  Skin:    General: Skin is warm and dry.     Coloration: Skin is not pale.     Findings: No rash.  Neurological:     Mental Status: She is alert.     Coordination: Coordination normal.     ED Results / Procedures / Treatments   Labs (all labs ordered are listed, but only abnormal results are displayed) Labs Reviewed  CBC - Abnormal; Notable for the following components:      Result Value   RBC 5.40 (*)    MCV 77.2 (*)    MCH 24.4 (*)    All other components within normal limits  URINALYSIS, ROUTINE W REFLEX MICROSCOPIC - Abnormal; Notable for the following components:   APPearance HAZY (*)    Hgb urine dipstick LARGE (*)    RBC / HPF >50 (*)    Bacteria, UA RARE (*)    All other components within normal limits  LIPASE, BLOOD  COMPREHENSIVE METABOLIC PANEL  I-STAT BETA HCG BLOOD, ED ([facility], [facility], [facility] ONLY)    EKG None  Radiology No results found.  Procedures Procedures (including  critical care time)  Medications Ordered in ED Medications  sodium chloride flush (NS) 0.9 % injection 3 mL (has no administration in time range)  morphine 4 MG/ML injection 4 mg (4 mg Intravenous Given 08/30/19 1425)    ED Course  I have reviewed the triage vital signs and the nursing notes.  Pertinent labs & imaging results that were available during my care of the patient were reviewed by me and considered in my medical decision making (see chart for details).    MDM Rules/Calculators/A&P      Patient presenting with abdominal pain described as a spasm over the last few weeks.  She is tender in the right upper and right lower abdomen.  Labs are reassuring.  CT abdomen pelvis is pending at shift change. At shift change, patient care transferred to [facility], PA-C for continued evaluation, follow up of CTAP and determination of disposition. Anticipate discharge with follow-up to GI if CT negative.  Will add Bentyl and/or Protonix.  Patient also evaluated by my attending, Dr. Particia Nearing, who guided the patient's management and agrees with plan.   Final Clinical Impression(s) / ED Diagnoses Final diagnoses:  Right upper quadrant abdominal pain  Right lower quadrant abdominal pain    Rx / DC Orders ED Discharge Orders    None       Emi Holes, PA-C 08/30/19 1524    Jacalyn Lefevre, MD 08/30/19 1623

## 2019-09-18 DIAGNOSIS — F419 Anxiety disorder, unspecified: Secondary | ICD-10-CM

## 2019-09-18 DIAGNOSIS — Z5900 Homelessness unspecified: Secondary | ICD-10-CM

## 2019-09-18 HISTORY — DX: Homelessness unspecified: Z59.00

## 2019-09-18 HISTORY — DX: Homelessness: Z59.0

## 2019-09-18 HISTORY — DX: Anxiety disorder, unspecified: F41.9

## 2019-10-02 ENCOUNTER — Ambulatory Visit (INDEPENDENT_AMBULATORY_CARE_PROVIDER_SITE_OTHER): Payer: Medicare Other | Admitting: Family Medicine

## 2019-10-02 ENCOUNTER — Encounter: Payer: Self-pay | Admitting: Family Medicine

## 2019-10-02 ENCOUNTER — Other Ambulatory Visit: Payer: Self-pay

## 2019-10-02 VITALS — BP 131/78 | HR 84 | Temp 98.4°F | Ht 62.4 in | Wt 215.0 lb

## 2019-10-02 DIAGNOSIS — Z09 Encounter for follow-up examination after completed treatment for conditions other than malignant neoplasm: Secondary | ICD-10-CM

## 2019-10-02 DIAGNOSIS — R1084 Generalized abdominal pain: Secondary | ICD-10-CM

## 2019-10-02 DIAGNOSIS — F419 Anxiety disorder, unspecified: Secondary | ICD-10-CM

## 2019-10-02 DIAGNOSIS — Z5900 Homelessness unspecified: Secondary | ICD-10-CM

## 2019-10-02 DIAGNOSIS — R809 Proteinuria, unspecified: Secondary | ICD-10-CM

## 2019-10-02 DIAGNOSIS — K2100 Gastro-esophageal reflux disease with esophagitis, without bleeding: Secondary | ICD-10-CM

## 2019-10-02 LAB — POCT URINALYSIS DIPSTICK
Bilirubin, UA: NEGATIVE
Blood, UA: NEGATIVE
Glucose, UA: NEGATIVE
Ketones, UA: NEGATIVE
Leukocytes, UA: NEGATIVE
Nitrite, UA: NEGATIVE
Protein, UA: NEGATIVE
Spec Grav, UA: 1.02 (ref 1.010–1.025)
Urobilinogen, UA: 0.2 E.U./dL
pH, UA: 7.5 (ref 5.0–8.0)

## 2019-10-02 MED ORDER — BUSPIRONE HCL 10 MG PO TABS: 10.0000 mg | ORAL_TABLET | Freq: Two times a day (BID) | ORAL | 3 refills | Status: DC

## 2019-10-02 NOTE — Patient Instructions (Signed)

## 2019-10-02 NOTE — Progress Notes (Signed)
Patient [facility] Follow Up  Subjective:  Patient ID: Sheri May, female    DOB: 05/09/1976  Age: 44 y.o. MRN: [id number]  CC:  Chief Complaint  Patient presents with  . Follow-up    2 mth follow up abd pain  . Hospitalization Follow-up    ED 08/30/19 ABD pain    HPI Sheri May is a 44 year old female who presents for Hospital Follow Up today.   Past Medical History:  Diagnosis Date  . Arthritis   . Asthma   . Fibromyalgia   . GERD without esophagitis 07/2019  . H/O scleroderma   . H/O: hysterectomy 2012   "partial"  . History of uterine fibroid    removal 2013  . History of vitamin D deficiency   . Hyperlipidemia 07/2019  . Systemic sclerosis ([place])   . Vitamin D deficiency 07/2019  . Vitamin D deficiency 07/2019    Current Status: Since her last office visit, she has has an ED visit for Right Upper Quadrant pain on 08/30/2019. Patient reports that she thought that she had a Hysterectomy in 2012. CT scan on 08/30/2019 reveals intact uterus. She has c/o generalized abdominal pain. She has had extensive GYN issues and we are awaiting Medical Records from her previous GYN. Denies GI problems such as nausea, vomiting, diarrhea, and constipation. She has no reports of blood in stools, dysuria and hematuria. Her anxiety is moderated today, r/t her recent move to [place] and still not content with her living environment. She denies suicidal ideations, homicidal ideations, or auditory hallucinations.    She denies fevers, chills, fatigue, recent infections, weight loss, and night sweats. She has not had any headaches, visual changes, dizziness, and falls. No chest pain, heart palpitations, cough and shortness of breath reported.  Past Surgical History:  Procedure Laterality Date  . ABLATION     uterine  . CARPAL TUNNEL RELEASE Bilateral   . CESAREAN SECTION  1997  . KNEE SURGERY Left    plate in left knee  .  MYOMECTOMY    . NASAL SEPTUM SURGERY      Family History  Problem Relation Age of Onset  . Hypertension Mother   . Fibromyalgia Mother   . Hypertension Father   . Multiple sclerosis Brother     Social History   Socioeconomic History  . Marital status: Single    Spouse name: Not on file  . Number of children: Not on file  . Years of education: Not on file  . Highest education level: Not on file  Occupational History  . Not on file  Tobacco Use  . Smoking status: Light Tobacco Smoker  . Smokeless tobacco: Never Used  Substance and Sexual Activity  . Alcohol use: Not Currently  . Drug use: Not Currently  . Sexual activity: Yes    Partners: Male    Birth control/protection: Condom  Other Topics Concern  . Not on file  Social History Narrative  . Not on file   Social Determinants of Health   Financial Resource Strain:   . Difficulty of Paying Living Expenses: Not on file  Food Insecurity:   . Worried About Charity fundraiser in the Last Year: Not on file  . Ran Out of Food in the Last Year: Not on file  Transportation Needs:   . Lack of Transportation (Medical): Not on file  . Lack of Transportation (Non-Medical): Not on file  Physical Activity:   .  Days of Exercise per Week: Not on file  . Minutes of Exercise per Session: Not on file  Stress:   . Feeling of Stress : Not on file  Social Connections:   . Frequency of Communication with Friends and Family: Not on file  . Frequency of Social Gatherings with Friends and Family: Not on file  . Attends Religious Services: Not on file  . Active Member of Clubs or Organizations: Not on file  . Attends Banker Meetings: Not on file  . Marital Status: Not on file  Intimate Partner Violence:   . Fear of Current or Ex-Partner: Not on file  . Emotionally Abused: Not on file  . Physically Abused: Not on file  . Sexually Abused: Not on file    Outpatient Medications Prior to Visit  Medication Sig Dispense  Refill  . albuterol (VENTOLIN HFA) 108 (90 Base) MCG/ACT inhaler Inhale 1-2 puffs into the lungs every 4 (four) hours as needed.    Marland Kitchen aspirin-acetaminophen-caffeine (EXCEDRIN MIGRAINE) 250-250-65 MG tablet Take 2 tablets by mouth every 6 (six) hours as needed for headache or migraine.    . budesonide-formoterol (SYMBICORT) 160-4.5 MCG/ACT inhaler Inhale 2 puffs into the lungs 2 (two) times daily.    . cetirizine (ZYRTEC) 10 MG tablet Take 1 tablet (10 mg total) by mouth daily. 30 tablet 11  . cyclobenzaprine (FLEXERIL) 10 MG tablet Take 10 mg by mouth 2 (two) times daily as needed for spasms.    . folic acid (FOLVITE) 1 MG tablet Take 1 mg by mouth every morning.    . levalbuterol (XOPENEX HFA) 45 MCG/ACT inhaler Inhale 2 puffs into the lungs every 8 (eight) hours as needed for wheezing or shortness of breath. 1 Inhaler 11  . montelukast (SINGULAIR) 10 MG tablet Take 10 mg by mouth at bedtime.    . Multiple Vitamin (MULTIVITAMIN) capsule Take 1 capsule by mouth daily.    . naproxen (NAPROSYN) 500 MG tablet Take 1 tablet (500 mg total) by mouth 2 (two) times daily as needed (pain). 60 tablet 3  . Olopatadine HCl 0.2 % SOLN Place 1-2 drops into both eyes daily.    Marland Kitchen omalizumab Geoffry Paradise) 150 MG/ML prefilled syringe Inject 375 mg into the skin every 14 (fourteen) days.    . Vitamin D, Ergocalciferol, (DRISDOL) 1.25 MG (50000 UT) CAPS capsule Take 1 capsule (50,000 Units total) by mouth every 7 (seven) days. 5 capsule 6  . famotidine (PEPCID) 20 MG tablet Take 1 tablet (20 mg total) by mouth daily. 30 tablet 3  . methotrexate (RHEUMATREX) 2.5 MG tablet Take 17.5 mg by mouth every Sunday. 7 tablets - 17.5 mg    . dicyclomine (BENTYL) 20 MG tablet Take 1 tablet (20 mg total) by mouth 2 (two) times daily as needed for spasms. 14 tablet 0  . mupirocin ointment (BACTROBAN) 2 % Apply 1 application topically 3 (three) times daily.     No facility-administered medications prior to visit.    Allergies    Allergen Reactions  . Ace Inhibitors     cough    ROS Review of Systems  Constitutional: Negative.   HENT: Negative.   Eyes: Negative.   Respiratory: Negative.   Cardiovascular: Negative.   Gastrointestinal: Positive for abdominal pain (generalized). Abdominal distention: obese.  Endocrine: Negative.   Genitourinary: Negative.   Musculoskeletal: Negative.   Skin: Negative.   Allergic/Immunologic: Negative.   Neurological: Negative.   Hematological: Negative.   Psychiatric/Behavioral: Negative.  Objective:    Physical Exam  Constitutional: She is oriented to person, place, and time. She appears well-developed and well-nourished.  HENT:  Head: Normocephalic and atraumatic.  Eyes: Conjunctivae are normal.  Cardiovascular: Normal rate, regular rhythm, normal heart sounds and intact distal pulses.  Pulmonary/Chest: Effort normal and breath sounds normal.  Abdominal: Soft. Bowel sounds are normal.  Musculoskeletal:        General: Normal range of motion.     Cervical back: Normal range of motion and neck supple.  Neurological: She is alert and oriented to person, place, and time. She has normal reflexes.  Skin: Skin is warm and dry.  Psychiatric: She has a normal mood and affect. Her behavior is normal. Judgment and thought content normal.  Nursing note and vitals reviewed.   BP 131/78   Pulse 84   Temp 98.4 F (36.9 C) (Oral)   Ht 5' 2.4" (1.585 m)   Wt 215 lb (97.5 kg)   SpO2 100%   BMI 38.82 kg/m  Wt Readings from Last 3 Encounters:  10/02/19 215 lb (97.5 kg)  08/03/19 207 lb (93.9 kg)     Health Maintenance Due  Topic Date Due  . TETANUS/TDAP  12/24/1995  . PAP SMEAR-Modifier  12/23/1997    There are no preventive care reminders to display for this patient.  Lab Results  Component Value Date   TSH 1.130 08/03/2019   Lab Results  Component Value Date   WBC 5.6 08/30/2019   HGB 13.2 08/30/2019   HCT 41.7 08/30/2019   MCV 77.2 (L)  08/30/2019   PLT 313 08/30/2019   Lab Results  Component Value Date   NA 139 08/30/2019   K 3.9 08/30/2019   CO2 26 08/30/2019   GLUCOSE 93 08/30/2019   BUN 8 08/30/2019   CREATININE 0.82 08/30/2019   BILITOT 0.7 08/30/2019   ALKPHOS 59 08/30/2019   AST 16 08/30/2019   ALT 11 08/30/2019   PROT 7.1 08/30/2019   ALBUMIN 4.0 08/30/2019   CALCIUM 9.1 08/30/2019   ANIONGAP 8 08/30/2019   Lab Results  Component Value Date   CHOL 312 (H) 08/03/2019   Lab Results  Component Value Date   HDL 68 08/03/2019   Lab Results  Component Value Date   LDLCALC 216 (H) 08/03/2019   Lab Results  Component Value Date   TRIG 151 (H) 08/03/2019   Lab Results  Component Value Date   CHOLHDL 4.6 (H) 08/03/2019   No results found for: HGBA1C    Assessment & Plan:   1. Hospital discharge follow-up  2. Generalized abdominal pain - Ambulatory referral to Gynecology  3. Proteinuria, unspecified type - Urinalysis Dipstick  4. Gastroesophageal reflux disease with esophagitis without hemorrhage  5. Homeless single person She is currently staying with 'friends' with no definate or permanent residence. She declined referral to our clinic social worker today.   6. Anxiety Moderate. We will initiate anti-anxiety medication today. Monitor.  - busPIRone (BUSPAR) 10 MG tablet; Take 1 tablet (10 mg total) by mouth 2 (two) times daily.  Dispense: 60 tablet; Refill: 3  7. Follow up She will follow up in 3 months.   Meds ordered this encounter  Medications  . busPIRone (BUSPAR) 10 MG tablet    Sig: Take 1 tablet (10 mg total) by mouth 2 (two) times daily.    Dispense:  60 tablet    Refill:  3    Orders Placed This Encounter  Procedures  . Ambulatory referral  to Gynecology  . Urinalysis Dipstick     Referral Orders     Ambulatory referral to Gynecology    Raliegh Ip,  MSN, FNP-BC [facility] Patient [facility] [address]  [place], [place] [address] [phone] fax  Problem List Items Addressed This Visit      Digestive   Gastroesophageal reflux disease with esophagitis without hemorrhage     Other   Homeless single person   Proteinuria   Relevant Orders   Urinalysis Dipstick (Completed)    Other Visit Diagnoses    Hospital discharge follow-up    -  Primary   Generalized abdominal pain       Relevant Orders   Ambulatory referral to Gynecology   Anxiety       Relevant Medications   busPIRone (BUSPAR) 10 MG tablet   Follow up          Meds ordered this encounter  Medications  . busPIRone (BUSPAR) 10 MG tablet    Sig: Take 1 tablet (10 mg total) by mouth 2 (two) times daily.    Dispense:  60 tablet    Refill:  3    Follow-up: No follow-ups on file.    Kallie Locks, FNP

## 2019-10-04 ENCOUNTER — Encounter: Payer: Self-pay | Admitting: Family Medicine

## 2019-10-04 DIAGNOSIS — F419 Anxiety disorder, unspecified: Secondary | ICD-10-CM | POA: Insufficient documentation

## 2019-11-19 ENCOUNTER — Other Ambulatory Visit: Payer: Self-pay

## 2019-11-19 ENCOUNTER — Other Ambulatory Visit (HOSPITAL_COMMUNITY)
Admission: RE | Admit: 2019-11-19 | Discharge: 2019-11-19 | Disposition: A | Discharge status: Home / Self Care | Payer: Medicare Other | Source: Ambulatory Visit | Attending: Obstetrics and Gynecology | Admitting: Obstetrics and Gynecology

## 2019-11-19 ENCOUNTER — Encounter: Payer: Self-pay | Admitting: General Practice

## 2019-11-19 ENCOUNTER — Ambulatory Visit (INDEPENDENT_AMBULATORY_CARE_PROVIDER_SITE_OTHER): Payer: Medicare Other | Admitting: Obstetrics and Gynecology

## 2019-11-19 ENCOUNTER — Encounter: Payer: Self-pay | Admitting: Obstetrics and Gynecology

## 2019-11-19 VITALS — BP 118/43 | HR 84 | Ht 62.0 in | Wt 213.0 lb

## 2019-11-19 DIAGNOSIS — N939 Abnormal uterine and vaginal bleeding, unspecified: Secondary | ICD-10-CM | POA: Diagnosis not present

## 2019-11-19 DIAGNOSIS — N949 Unspecified condition associated with female genital organs and menstrual cycle: Secondary | ICD-10-CM | POA: Diagnosis not present

## 2019-11-19 DIAGNOSIS — R1900 Intra-abdominal and pelvic swelling, mass and lump, unspecified site: Secondary | ICD-10-CM

## 2019-11-19 DIAGNOSIS — R1084 Generalized abdominal pain: Secondary | ICD-10-CM | POA: Insufficient documentation

## 2019-11-19 DIAGNOSIS — Z90710 Acquired absence of both cervix and uterus: Secondary | ICD-10-CM

## 2019-11-19 NOTE — Progress Notes (Signed)
Obstetrics and Gynecology New Patient Evaluation  Appointment Date: 11/19/2019  OBGYN Clinic: [facility]  Primary Care Provider: Kallie Locks  Referring Provider: Kallie Locks, FNP  Chief Complaint:  Chief Complaint  Patient presents with  . Referral    abdominal pain     History of Present Illness: Noemy Hallmon is a 44 y.o. African-American [id number] (No LMP recorded. Patient has had a hysterectomy.), seen for the above chief complaint. Her past medical history is significant for multiple medical co-morbidities.  Patient endorses cramping that is like labor and/or a menstrual cycle that has been ongoing for over the past year. It's not every day and can be intense and lasts for a few minutes to all day. She also endorses occasional dyspareunia.   She also endorses occasional VB, spotting and sometimes clots with last episode about a month ago.   Review of Systems: Pertinent items noted in HPI and remainder of comprehensive ROS otherwise negative.   Patient Active Problem List   Diagnosis Date Noted  . Anxiety 10/04/2019  . Homeless single person 08/04/2019  . Proteinuria 08/04/2019  . Gastroesophageal reflux disease with esophagitis without hemorrhage 08/04/2019  . Shortness of breath 08/04/2019  . Vitamin D deficiency 08/04/2019  . Vitamin B12 deficiency 08/04/2019  . Class 2 severe obesity due to excess calories with serious comorbidity and body mass index (BMI) of 37.0 to 37.9 in adult [facility]) 08/04/2019  . Generalized abdominal cramps 08/04/2019    Past Medical History:  Past Medical History:  Diagnosis Date  . Anxiety 09/2019  . Arthritis   . Asthma   . Fibromyalgia   . GERD without esophagitis 07/2019  . H/O scleroderma   . H/O: hysterectomy 2012   "partial"  . History of uterine fibroid    removal 2013  . History of vitamin D deficiency   . Homelessness 09/2019  . Hyperlipidemia 07/2019  . Systemic sclerosis ([facility])   .  Vitamin D deficiency 07/2019    Past Surgical History:  Past Surgical History:  Procedure Laterality Date  . CARPAL TUNNEL RELEASE Bilateral   . CESAREAN SECTION  1997  . ENDOMETRIAL ABLATION  2003  . KNEE SURGERY Left    plate in left knee  . LAPAROSCOPIC SUPRACERVICAL HYSTERECTOMY  2012   Bleeding, pain  . MYOMECTOMY     hysteroscopy, at time of ablation  . NASAL SEPTUM SURGERY    . TUBAL LIGATION     postpartum after VBAC    Past Obstetrical History:  OB History  Gravida Para Term Preterm AB Living  4 2 2   2 1   SAB TAB Ectopic Multiple Live Births  2       2    # Outcome Date GA Lbr Len/2nd Weight Sex Delivery Anes PTL Lv  4 Term           3 Term           2 SAB           1 SAB             Obstetric Comments  G1: c/s. SIDS at 55m   G2: SAB  G3: VBAC and BTL. 2000  G4: SAB. 2010    Past Gynecological History: As per HPI. Last pap: 2017, negative  Social History:  Social History   Socioeconomic History  . Marital status: Single    Spouse name: Not on file  . Number of children: Not on file  .  Years of education: Not on file  . Highest education level: Not on file  Occupational History  . Not on file  Tobacco Use  . Smoking status: Light Tobacco Smoker  . Smokeless tobacco: Never Used  Substance and Sexual Activity  . Alcohol use: Not Currently  . Drug use: Not Currently  . Sexual activity: Yes    Partners: Male    Birth control/protection: Condom  Other Topics Concern  . Not on file  Social History Narrative  . Not on file   Social Determinants of Health   Financial Resource Strain:   . Difficulty of Paying Living Expenses: Not on file  Food Insecurity:   . Worried About Charity fundraiser in the Last Year: Not on file  . Ran Out of Food in the Last Year: Not on file  Transportation Needs:   . Lack of Transportation (Medical): Not on file  . Lack of Transportation (Non-Medical): Not on file  Physical Activity:   . Days of Exercise per  Week: Not on file  . Minutes of Exercise per Session: Not on file  Stress:   . Feeling of Stress : Not on file  Social Connections:   . Frequency of Communication with Friends and Family: Not on file  . Frequency of Social Gatherings with Friends and Family: Not on file  . Attends Religious Services: Not on file  . Active Member of Clubs or Organizations: Not on file  . Attends Archivist Meetings: Not on file  . Marital Status: Not on file  Intimate Partner Violence:   . Fear of Current or Ex-Partner: Not on file  . Emotionally Abused: Not on file  . Physically Abused: Not on file  . Sexually Abused: Not on file    Family History:  Family History  Problem Relation Age of Onset  . Hypertension Mother   . Fibromyalgia Mother   . Hypertension Father   . Multiple sclerosis Brother     Medications Dalya Maselli "Nay" had no medications administered during this visit. Current Outpatient Medications  Medication Sig Dispense Refill  . albuterol (VENTOLIN HFA) 108 (90 Base) MCG/ACT inhaler Inhale 1-2 puffs into the lungs every 4 (four) hours as needed.    Marland Kitchen aspirin-acetaminophen-caffeine (EXCEDRIN MIGRAINE) 250-250-65 MG tablet Take 2 tablets by mouth every 6 (six) hours as needed for headache or migraine.    . budesonide-formoterol (SYMBICORT) 160-4.5 MCG/ACT inhaler Inhale 2 puffs into the lungs 2 (two) times daily.    . busPIRone (BUSPAR) 10 MG tablet Take 1 tablet (10 mg total) by mouth 2 (two) times daily. 60 tablet 3  . cetirizine (ZYRTEC) 10 MG tablet Take 1 tablet (10 mg total) by mouth daily. 30 tablet 11  . cyclobenzaprine (FLEXERIL) 10 MG tablet Take 10 mg by mouth 2 (two) times daily as needed for spasms.    . famotidine (PEPCID) 20 MG tablet Take 1 tablet (20 mg total) by mouth daily. 30 tablet 3  . folic acid (FOLVITE) 1 MG tablet Take 1 mg by mouth every morning.    . montelukast (SINGULAIR) 10 MG tablet Take 10 mg by mouth at bedtime.    . Multiple Vitamin  (MULTIVITAMIN) capsule Take 1 capsule by mouth daily.    . naproxen (NAPROSYN) 500 MG tablet Take 1 tablet (500 mg total) by mouth 2 (two) times daily as needed (pain). 60 tablet 3  . Olopatadine HCl 0.2 % SOLN Place 1-2 drops into both eyes daily.    Marland Kitchen  omalizumab Geoffry Paradise) 150 MG/ML prefilled syringe Inject 375 mg into the skin every 14 (fourteen) days.    . Vitamin D, Ergocalciferol, (DRISDOL) 1.25 MG (50000 UT) CAPS capsule Take 1 capsule (50,000 Units total) by mouth every 7 (seven) days. 5 capsule 6  . levalbuterol (XOPENEX HFA) 45 MCG/ACT inhaler Inhale 2 puffs into the lungs every 8 (eight) hours as needed for wheezing or shortness of breath. (Patient not taking: Reported on 11/19/2019) 1 Inhaler 11  . methotrexate (RHEUMATREX) 2.5 MG tablet Take 17.5 mg by mouth every Sunday. 7 tablets - 17.5 mg     No current facility-administered medications for this visit.    Allergies Ace inhibitors   Physical Exam:  BP (!) 118/43   Pulse 84   Ht 5\' 2"  (1.575 m)   Wt 213 lb (96.6 kg)   BMI 38.96 kg/m  Body mass index is 38.96 kg/m. General appearance: Well nourished, well developed female in no acute distress.  Cardiovascular: normal s1 and s2.  No murmurs, rubs or gallops. Respiratory:  Clear to auscultation bilateral. Normal respiratory effort Abdomen: positive bowel sounds and no masses, hernias; diffusely non tender to palpation, non distended. Well healed l/s port sites and low transverse skin incision Neuro/Psych:  Normal mood and affect.  Skin:  Warm and dry.  Lymphatic:  No inguinal lymphadenopathy.   Pelvic exam: is not limited by body habitus EGBUS: within normal limits Vagina: within normal limits and with no blood or discharge in the vault Cervix: normal appearing cervix without tenderness, discharge or lesions.  Uterus:  No suprapubic masses.  Adnexa:  normal adnexa and no mass, fullness, tenderness Rectovaginal: deferred  Laboratory: none  Radiology:  CLINICAL DATA:   Acute right-sided abdominal pain  EXAM: CT ABDOMEN AND PELVIS WITH CONTRAST  TECHNIQUE: Multidetector CT imaging of the abdomen and pelvis was performed using the standard protocol following bolus administration of intravenous contrast.  CONTRAST:  OMNIPAQUE IOHEXOL 300 MG/ML  SOLN  COMPARISON:  None.  FINDINGS: Lower chest: No acute abnormality.  Hepatobiliary: No focal liver abnormality is seen. No gallstones, gallbladder wall thickening, or biliary dilatation.  Pancreas: Unremarkable. No pancreatic ductal dilatation or surrounding inflammatory changes.  Spleen: Normal in size without focal abnormality.  Adrenals/Urinary Tract: The adrenal glands are within normal limits bilaterally. Kidneys are well visualized bilaterally without renal calculi or obstructive change. The bladder is partially distended.  Stomach/Bowel: Scattered mild diverticular change of the colon is noted. No findings to suggest diverticulitis are seen. The appendix is well visualized and within normal limits. No small bowel abnormality is seen. The stomach is within normal limits.  Vascular/Lymphatic: No significant vascular findings are present. No enlarged abdominal or pelvic lymph nodes.  Reproductive: The uterus is retroflexed dominant 5 cm cyst is noted in the right adnexa.  Other: No abdominal wall hernia or abnormality. No abdominopelvic ascites.  Musculoskeletal: Degenerative changes of lumbar spine are noted.  IMPRESSION: Right adnexal cyst.  No associated inflammatory changes seen.  No other focal abnormality is noted.   Electronically Signed   By: M.D.   On: 08/30/2019 15:41  Assessment: pt stable  Plan:  1. Generalized abdominal cramps Patient states she has the op report and will send 09/01/2019 a copy and she says it was robotic; pt unsure why cervix was left. I told her that the path is in care everywhere and uterus and fibroids noted and no  ovaries or cervix or tubes, so she did have a hyst; likely  CT scan read w/o knowledge of hyst and I told her that the cervix can look like a uterus on CT. I told her I recommend MRI A/P. Pathology states uterus was morcellated so possibility of sequelae from the morcellation  Follow up pap, op report. Consider a diagnostic laparoscopy and would likely recommend trachelectomy at that time too or trial of medical therapy first to suppress her hormones to see if she can get relief from that.  - Cytology - PAP - MR PELVIS W CONTRAST; Future  2. Intra-abdominal and pelvic swelling, mass and lump, unspecified site - MR ABDOMEN W CONTRAST; Future  3. History of hysterectomy - MR PELVIS W CONTRAST; Future  Orders Placed This Encounter  Procedures  . MR ABDOMEN W CONTRAST  . MR PELVIS W CONTRAST    RTC after MRI  Cornelia Copa MD Attending [facility])

## 2019-11-23 LAB — CYTOLOGY - PAP
Chlamydia: NEGATIVE
Comment: NEGATIVE
Comment: NEGATIVE
Comment: NEGATIVE
Comment: NORMAL
Diagnosis: NEGATIVE
Diagnosis: REACTIVE
High risk HPV: NEGATIVE
Neisseria Gonorrhea: NEGATIVE
Trichomonas: NEGATIVE

## 2019-12-02 ENCOUNTER — Ambulatory Visit (HOSPITAL_COMMUNITY)
Admission: RE | Admit: 2019-12-02 | Discharge: 2019-12-02 | Disposition: A | Discharge status: Home / Self Care | Payer: Medicare Other | Source: Ambulatory Visit | Attending: Obstetrics and Gynecology | Admitting: Obstetrics and Gynecology

## 2019-12-02 ENCOUNTER — Other Ambulatory Visit: Payer: Self-pay

## 2019-12-02 DIAGNOSIS — R1084 Generalized abdominal pain: Secondary | ICD-10-CM

## 2019-12-02 DIAGNOSIS — Z90710 Acquired absence of both cervix and uterus: Secondary | ICD-10-CM | POA: Diagnosis present

## 2019-12-02 DIAGNOSIS — R1900 Intra-abdominal and pelvic swelling, mass and lump, unspecified site: Secondary | ICD-10-CM | POA: Insufficient documentation

## 2019-12-02 MED ORDER — GADOBUTROL 1 MMOL/ML IV SOLN
10.0000 mL | Freq: Once | INTRAVENOUS | Status: AC | PRN
  Administered 2019-12-02: 10 mL via INTRAVENOUS

## 2019-12-14 ENCOUNTER — Telehealth: Payer: Self-pay | Admitting: Obstetrics and Gynecology

## 2019-12-14 NOTE — Telephone Encounter (Signed)
GYN Telephone Note Patient called at [phone] and d/w her re: the MRI results. I told her the right ovarian cyst is likely leftover from December but it is half the size of what it was before 3-3.5cm now and is simple; she states her pain better than at her office visit. . She is wondering if the discomfort could be left over from her old c-section since she had wound healing issues, since the discomfort she has feels like pulling. I told her it certaintly could be; I told her I don't have the op note but presumably if there was scar tissue at the time of her hyst that they would've gotten rid of it in that area so if the discomfort is still there post hyst, repeat surgery likely wouldn't help and it could be scar tissue from the c-section that is in the skin/muscle itself.   I told her I recommend keeping her office visit for late April and seeing how her symptoms are then and deciding on next steps at that time. Pt is amenable to plan  Cornelia Copa MD Attending [facility] 12/14/2019 Time: [phone]

## 2019-12-19 ENCOUNTER — Other Ambulatory Visit: Payer: Self-pay | Admitting: Family Medicine

## 2019-12-19 DIAGNOSIS — K2100 Gastro-esophageal reflux disease with esophagitis, without bleeding: Secondary | ICD-10-CM

## 2020-01-01 ENCOUNTER — Other Ambulatory Visit: Payer: Self-pay

## 2020-01-01 ENCOUNTER — Ambulatory Visit: Payer: Medicare Other | Admitting: Family Medicine

## 2020-01-01 ENCOUNTER — Ambulatory Visit (INDEPENDENT_AMBULATORY_CARE_PROVIDER_SITE_OTHER): Payer: Medicare Other | Admitting: Family Medicine

## 2020-01-01 ENCOUNTER — Encounter: Payer: Self-pay | Admitting: Family Medicine

## 2020-01-01 VITALS — BP 130/63 | HR 81 | Temp 99.0°F | Ht 62.0 in | Wt 227.8 lb

## 2020-01-01 DIAGNOSIS — K2100 Gastro-esophageal reflux disease with esophagitis, without bleeding: Secondary | ICD-10-CM | POA: Diagnosis not present

## 2020-01-01 DIAGNOSIS — F419 Anxiety disorder, unspecified: Secondary | ICD-10-CM

## 2020-01-01 DIAGNOSIS — Z09 Encounter for follow-up examination after completed treatment for conditions other than malignant neoplasm: Secondary | ICD-10-CM

## 2020-01-01 DIAGNOSIS — M7752 Other enthesopathy of left foot: Secondary | ICD-10-CM | POA: Diagnosis not present

## 2020-01-01 DIAGNOSIS — R1084 Generalized abdominal pain: Secondary | ICD-10-CM

## 2020-01-01 DIAGNOSIS — Z23 Encounter for immunization: Secondary | ICD-10-CM

## 2020-01-01 DIAGNOSIS — Z8739 Personal history of other diseases of the musculoskeletal system and connective tissue: Secondary | ICD-10-CM

## 2020-01-01 DIAGNOSIS — Z0000 Encounter for general adult medical examination without abnormal findings: Secondary | ICD-10-CM

## 2020-01-01 LAB — POCT URINALYSIS DIPSTICK
Bilirubin, UA: NEGATIVE
Blood, UA: NEGATIVE
Glucose, UA: NEGATIVE
Ketones, UA: NEGATIVE
Leukocytes, UA: NEGATIVE
Nitrite, UA: NEGATIVE
Protein, UA: NEGATIVE
Spec Grav, UA: 1.02 (ref 1.010–1.025)
Urobilinogen, UA: 0.2 E.U./dL
pH, UA: 7 (ref 5.0–8.0)

## 2020-01-01 MED ORDER — BUSPIRONE HCL 15 MG PO TABS: 15.0000 mg | ORAL_TABLET | Freq: Two times a day (BID) | ORAL | 6 refills | Status: DC

## 2020-01-01 NOTE — Progress Notes (Signed)
[facility]    Established Patient Office Visit  Subjective:  Patient ID: Sheri May, female    DOB: 1975/12/08  Age: 44 y.o. MRN: [id number]  CC:  Chief Complaint  Patient presents with  . Follow-up    HPI Sheri May is a 44 year old female who presents for Follow Up today.   Past Medical History:  Diagnosis Date  . Anxiety 09/2019  . Arthritis   . Asthma   . Bone spur of left foot   . Fibromyalgia   . GERD without esophagitis 07/2019  . H/O scleroderma   . H/O: hysterectomy 2012   "partial"  . History of uterine fibroid    removal 2013  . History of vitamin D deficiency   . Homelessness 09/2019  . Hyperlipidemia 07/2019  . Systemic sclerosis (HCC)   . Vitamin D deficiency 07/2019   Current Status: Since her last office visit, she has c/o right foot spur. Her anxiety is stable today. She denies suicidal ideations, homicidal ideations, or auditory hallucinations. She denies fevers, chills, fatigue, recent infections, weight loss, and night sweats. She has not had any headaches, visual changes, dizziness, and falls. No chest pain, heart palpitations, cough and shortness of breath reported. Denies GI problems such as nausea, vomiting, diarrhea, and constipation. She has no reports of blood in stools, dysuria and hematuria. She is taking all medications as prescribed. She denies pain today.   Past Surgical History:  Procedure Laterality Date  . CARPAL TUNNEL RELEASE Bilateral   . CESAREAN SECTION  1997  . ENDOMETRIAL ABLATION  2003  . KNEE SURGERY Left    plate in left knee  . LAPAROSCOPIC SUPRACERVICAL HYSTERECTOMY  2012   Bleeding, pain  . MYOMECTOMY     hysteroscopy, at time of ablation  . NASAL SEPTUM SURGERY    . TUBAL LIGATION     postpartum after VBAC    Family History  Problem Relation Age of Onset  . Hypertension Mother   . Fibromyalgia Mother   . Hypertension Father   . Multiple sclerosis Brother      Social History   Socioeconomic History  . Marital status: Single    Spouse name: Not on file  . Number of children: Not on file  . Years of education: Not on file  . Highest education level: Not on file  Occupational History  . Not on file  Tobacco Use  . Smoking status: Light Tobacco Smoker  . Smokeless tobacco: Never Used  Substance and Sexual Activity  . Alcohol use: Not Currently  . Drug use: Not Currently  . Sexual activity: Not Currently    Partners: Male    Birth control/protection: Condom  Other Topics Concern  . Not on file  Social History Narrative  . Not on file   Social Determinants of Health   Financial Resource Strain:   . Difficulty of Paying Living Expenses:   Food Insecurity:   . Worried About Programme researcher, broadcasting/film/video in the Last Year:   . Barista in the Last Year:   Transportation Needs:   . Freight forwarder (Medical):   Marland Kitchen Lack of Transportation (Non-Medical):   Physical Activity:   . Days of Exercise per Week:   . Minutes of Exercise per Session:   Stress:   . Feeling of Stress :   Social Connections:   . Frequency of Communication with Friends and Family:   . Frequency of  Social Gatherings with Friends and Family:   . Attends Religious Services:   . Active Member of Clubs or Organizations:   . Attends Banker Meetings:   Marland Kitchen Marital Status:   Intimate Partner Violence:   . Fear of Current or Ex-Partner:   . Emotionally Abused:   Marland Kitchen Physically Abused:   . Sexually Abused:     Outpatient Medications Prior to Visit  Medication Sig Dispense Refill  . albuterol (VENTOLIN HFA) 108 (90 Base) MCG/ACT inhaler Inhale 1-2 puffs into the lungs every 4 (four) hours as needed.    Marland Kitchen aspirin-acetaminophen-caffeine (EXCEDRIN MIGRAINE) 250-250-65 MG tablet Take 2 tablets by mouth every 6 (six) hours as needed for headache or migraine.    . cetirizine (ZYRTEC) 10 MG tablet Take 1 tablet (10 mg total) by mouth daily. 30 tablet 11  .  cyclobenzaprine (FLEXERIL) 10 MG tablet Take 10 mg by mouth 2 (two) times daily as needed for spasms.    . famotidine (PEPCID) 20 MG tablet TAKE 1 TABLET(20 MG) BY MOUTH DAILY 30 tablet 3  . folic acid (FOLVITE) 1 MG tablet Take 1 mg by mouth every morning.    . montelukast (SINGULAIR) 10 MG tablet Take 10 mg by mouth at bedtime.    . Multiple Vitamin (MULTIVITAMIN) capsule Take 1 capsule by mouth daily.    . naproxen (NAPROSYN) 500 MG tablet Take 1 tablet (500 mg total) by mouth 2 (two) times daily as needed (pain). 60 tablet 3  . Olopatadine HCl 0.2 % SOLN Place 1-2 drops into both eyes daily.    Marland Kitchen omalizumab Geoffry Paradise) 150 MG/ML prefilled syringe Inject 375 mg into the skin every 14 (fourteen) days.    . Vitamin D, Ergocalciferol, (DRISDOL) 1.25 MG (50000 UT) CAPS capsule Take 1 capsule (50,000 Units total) by mouth every 7 (seven) days. 5 capsule 6  . busPIRone (BUSPAR) 10 MG tablet Take 1 tablet (10 mg total) by mouth 2 (two) times daily. 60 tablet 3  . budesonide-formoterol (SYMBICORT) 160-4.5 MCG/ACT inhaler Inhale 2 puffs into the lungs 2 (two) times daily.    Marland Kitchen levalbuterol (XOPENEX HFA) 45 MCG/ACT inhaler Inhale 2 puffs into the lungs every 8 (eight) hours as needed for wheezing or shortness of breath. (Patient not taking: Reported on 11/19/2019) 1 Inhaler 11  . methotrexate (RHEUMATREX) 2.5 MG tablet Take 17.5 mg by mouth every Sunday. 7 tablets - 17.5 mg     No facility-administered medications prior to visit.    Allergies  Allergen Reactions  . Ace Inhibitors     cough    ROS Review of Systems  Constitutional: Negative.   HENT: Negative.   Eyes: Negative.   Respiratory: Negative.   Cardiovascular: Negative.   Gastrointestinal: Negative.   Endocrine: Negative.   Genitourinary: Negative.   Musculoskeletal: Negative.   Skin: Negative.       Objective:    Physical Exam  Constitutional: She is oriented to person, place, and time. She appears well-developed and  well-nourished.  HENT:  Head: Normocephalic and atraumatic.  Eyes: Conjunctivae are normal.  Cardiovascular: Normal rate, regular rhythm, normal heart sounds and intact distal pulses.  Pulmonary/Chest: Effort normal and breath sounds normal.  Abdominal: Soft. Bowel sounds are normal.  Musculoskeletal:        General: Normal range of motion.     Cervical back: Neck supple.  Neurological: She is alert and oriented to person, place, and time. She has normal reflexes.  Skin: Skin is warm and dry.  Psychiatric: She has a normal mood and affect. Her behavior is normal. Judgment and thought content normal.  Nursing note and vitals reviewed.   BP 130/63   Pulse 81   Temp 99 F (37.2 C)   Ht 5\' 2"  (1.575 m)   Wt 227 lb 12.8 oz (103.3 kg)   SpO2 100%   BMI 41.67 kg/m  Wt Readings from Last 3 Encounters:  01/01/20 227 lb 12.8 oz (103.3 kg)  11/19/19 213 lb (96.6 kg)  10/02/19 215 lb (97.5 kg)     Health Maintenance Due  Topic Date Due  . TETANUS/TDAP  Never done    There are no preventive care reminders to display for this patient.  Lab Results  Component Value Date   TSH 1.130 08/03/2019   Lab Results  Component Value Date   WBC 5.6 08/30/2019   HGB 13.2 08/30/2019   HCT 41.7 08/30/2019   MCV 77.2 (L) 08/30/2019   PLT 313 08/30/2019   Lab Results  Component Value Date   NA 139 08/30/2019   K 3.9 08/30/2019   CO2 26 08/30/2019   GLUCOSE 93 08/30/2019   BUN 8 08/30/2019   CREATININE 0.82 08/30/2019   BILITOT 0.7 08/30/2019   ALKPHOS 59 08/30/2019   AST 16 08/30/2019   ALT 11 08/30/2019   PROT 7.1 08/30/2019   ALBUMIN 4.0 08/30/2019   CALCIUM 9.1 08/30/2019   ANIONGAP 8 08/30/2019   Lab Results  Component Value Date   CHOL 312 (H) 08/03/2019   Lab Results  Component Value Date   HDL 68 08/03/2019   Lab Results  Component Value Date   LDLCALC 216 (H) 08/03/2019   Lab Results  Component Value Date   TRIG 151 (H) 08/03/2019   Lab Results  Component  Value Date   CHOLHDL 4.6 (H) 08/03/2019   No results found for: HGBA1C    Assessment & Plan:   1. Generalized abdominal pain  2. Gastroesophageal reflux disease with esophagitis without hemorrhage  3. Anxiety - busPIRone (BUSPAR) 15 MG tablet; Take 1 tablet (15 mg total) by mouth 2 (two) times daily.  Dispense: 60 tablet; Refill: 6  4. Bone spur of left foot - Ambulatory referral to Podiatry  5. History of scleroderma - Ambulatory referral to Rheumatology  6. Need for vaccination  7. Healthcare maintenance - POCT urinalysis dipstick  8. Follow up She will follow up in 6 months.   Meds ordered this encounter  Medications  . busPIRone (BUSPAR) 15 MG tablet    Sig: Take 1 tablet (15 mg total) by mouth 2 (two) times daily.    Dispense:  60 tablet    Refill:  6    Orders Placed This Encounter  Procedures  . Ambulatory referral to Podiatry  . Ambulatory referral to Rheumatology  . POCT urinalysis dipstick     Referral Orders     Ambulatory referral to Podiatry     Ambulatory referral to Rheumatology   08/05/2019,  MSN, FNP-BC [facility] [address] [place], [place] [phone] fax  Problem List Items Addressed This Visit      Digestive   Gastroesophageal reflux disease with esophagitis without hemorrhage     Other   Anxiety   Relevant Medications   busPIRone (BUSPAR) 15 MG tablet    Other Visit Diagnoses    Generalized abdominal pain    -  Primary   Bone spur  of left foot       Relevant Orders   Ambulatory referral to Podiatry   History of scleroderma       Relevant Orders   Ambulatory referral to Rheumatology   Need for vaccination       Healthcare maintenance       Relevant Orders   POCT urinalysis dipstick (Completed)   Follow up          Meds ordered this encounter  Medications  . busPIRone (BUSPAR) 15 MG tablet    Sig: Take 1 tablet  (15 mg total) by mouth 2 (two) times daily.    Dispense:  60 tablet    Refill:  6    Follow-up: Return in about 6 months (around 07/02/2020).    Azzie Glatter, FNP

## 2020-01-11 ENCOUNTER — Ambulatory Visit (INDEPENDENT_AMBULATORY_CARE_PROVIDER_SITE_OTHER): Payer: Medicare Other | Admitting: Obstetrics and Gynecology

## 2020-01-11 ENCOUNTER — Other Ambulatory Visit: Payer: Self-pay

## 2020-01-11 ENCOUNTER — Encounter: Payer: Self-pay | Admitting: Obstetrics and Gynecology

## 2020-01-11 VITALS — BP 110/58 | HR 78 | Wt 234.4 lb

## 2020-01-11 DIAGNOSIS — R1030 Lower abdominal pain, unspecified: Secondary | ICD-10-CM

## 2020-01-11 DIAGNOSIS — Z1231 Encounter for screening mammogram for malignant neoplasm of breast: Secondary | ICD-10-CM | POA: Diagnosis not present

## 2020-01-11 DIAGNOSIS — Z30013 Encounter for initial prescription of injectable contraceptive: Secondary | ICD-10-CM

## 2020-01-11 MED ORDER — MEDROXYPROGESTERONE ACETATE 150 MG/ML IM SUSP
150.0000 mg | Freq: Once | INTRAMUSCULAR | Status: AC
  Administered 2020-01-11: 150 mg via INTRAMUSCULAR

## 2020-01-11 NOTE — Progress Notes (Signed)
Obstetrics and Gynecology Visit Return Patient Evaluation  Appointment Date: 01/11/2020  Primary Care Provider: [place] [place] [facility]: [facility]  Chief Complaint: follow up abdominal pain  History of Present Illness:  Sheri May is a 44 y.o. initially seen by me on 3/4 for abdominal pain and VB with a h/o some type of hysterectomy. I did a pap smear and confirmed she had a cervix on exam.  Her pap and STI swab was negative and I ordered an mri.  Interval History: since last visit patient still has some pain like labor pains. Patient's MRI showed only cervix and ovaries seen with smaller right ovarian simple cyst (3.5cm from 6.7cm)  Review of Systems: as noted in the History of Present Illness.  Patient Active Problem List   Diagnosis Date Noted  . Bone spur of left foot 01/01/2020  . Vaginal bleeding 11/19/2019  . Adnexal cyst 11/19/2019  . Anxiety 10/04/2019  . Homeless single person 08/04/2019  . Proteinuria 08/04/2019  . Gastroesophageal reflux disease with esophagitis without hemorrhage 08/04/2019  . Shortness of breath 08/04/2019  . Vitamin D deficiency 08/04/2019  . Vitamin B12 deficiency 08/04/2019  . Class 2 severe obesity due to excess calories with serious comorbidity and body mass index (BMI) of 37.0 to 37.9 in adult [facility]) 08/04/2019  . Generalized abdominal cramps 08/04/2019   Medications:  We administered medroxyPROGESTERone. Current Outpatient Medications  Medication Sig Dispense Refill  . albuterol (VENTOLIN HFA) 108 (90 Base) MCG/ACT inhaler Inhale 1-2 puffs into the lungs every 4 (four) hours as needed.    Marland Kitchen aspirin-acetaminophen-caffeine (EXCEDRIN MIGRAINE) 250-250-65 MG tablet Take 2 tablets by mouth every 6 (six) hours as needed for headache or migraine.    . budesonide-formoterol (SYMBICORT) 160-4.5 MCG/ACT inhaler Inhale 2 puffs into the lungs 2 (two) times daily.    . busPIRone (BUSPAR) 15 MG tablet Take 1 tablet (15  mg total) by mouth 2 (two) times daily. 60 tablet 6  . cetirizine (ZYRTEC) 10 MG tablet Take 1 tablet (10 mg total) by mouth daily. 30 tablet 11  . cyclobenzaprine (FLEXERIL) 10 MG tablet Take 10 mg by mouth 2 (two) times daily as needed for spasms.    . famotidine (PEPCID) 20 MG tablet TAKE 1 TABLET(20 MG) BY MOUTH DAILY 30 tablet 3  . folic acid (FOLVITE) 1 MG tablet Take 1 mg by mouth every morning.    . methotrexate (RHEUMATREX) 2.5 MG tablet Take 17.5 mg by mouth every Sunday. 7 tablets - 17.5 mg    . montelukast (SINGULAIR) 10 MG tablet Take 10 mg by mouth at bedtime.    . Multiple Vitamin (MULTIVITAMIN) capsule Take 1 capsule by mouth daily.    . naproxen (NAPROSYN) 500 MG tablet Take 1 tablet (500 mg total) by mouth 2 (two) times daily as needed (pain). 60 tablet 3  . Olopatadine HCl 0.2 % SOLN Place 1-2 drops into both eyes daily.    Marland Kitchen omalizumab Arvid Right) 150 MG/ML prefilled syringe Inject 375 mg into the skin every 14 (fourteen) days.    . Vitamin D, Ergocalciferol, (DRISDOL) 1.25 MG (50000 UT) CAPS capsule Take 1 capsule (50,000 Units total) by mouth every 7 (seven) days. 5 capsule 6  . levalbuterol (XOPENEX HFA) 45 MCG/ACT inhaler Inhale 2 puffs into the lungs every 8 (eight) hours as needed for wheezing or shortness of breath. (Patient not taking: Reported on 11/19/2019) 1 Inhaler 11   No current facility-administered medications for this visit.  Allergies: is allergic to ace inhibitors.  Physical Exam:  BP (!) 110/58   Pulse 78   Wt 234 lb 6.4 oz (106.3 kg)   BMI 42.87 kg/m  Body mass index is 42.87 kg/m. General appearance: Well nourished, well developed female in no acute distress.  Neuro/Psych:  Normal mood and affect.    Radiology CLINICAL DATA:  Abdominal pain with vaginal bleeding. Abdominal swelling. Prior supracervical hysterectomy. Right adnexal cystic lesion.  EXAM: MRI ABDOMEN AND PELVIS WITHOUT AND WITH CONTRAST  TECHNIQUE: Multiplanar multisequence  MR imaging of the abdomen and pelvis was performed both before and after the administration of intravenous contrast.  CONTRAST:  64mL GADAVIST GADOBUTROL 1 MMOL/ML IV SOLN  COMPARISON:  CT abdomen from 08/30/2019  FINDINGS: COMBINED FINDINGS FOR BOTH MR ABDOMEN AND PELVIS  Lower chest: Unremarkable  Hepatobiliary: Unremarkable  Pancreas:  Unremarkable  Spleen:  Unremarkable  Adrenals/Urinary Tract: Unremarkable. Urinary bladder appears normal.  Stomach/Bowel: Prominent stool throughout the colon favors constipation.  Vascular/Lymphatic: Bilateral upper normal sized external iliac lymph nodes without overt pathologic adenopathy.  Reproductive: Residuum of the cervix noted. The remainder of the uterus is absent.  Right adnexal cystic lesion 3.4 by 1.9 by 3.5 cm (volume = 12 cm^3), simple appearance with no appreciable enhancement or nodularity. This previously measured 6.7 by 3.6 by 3.5 cm (volume = 44 cm^3) on 08/30/2019.  Left ovarian cystic lesion 1.4 by 1.1 by 1.5 cm in diameter, simple appearance.  Other:  No supplemental non-categorized findings.  Musculoskeletal: Spondylosis and degenerative disc disease at L5 causing left greater than right foraminal impingement.  IMPRESSION: 1. The right adnexal cystic lesion currently measures 12 cubic cm in volume, reduced from 44 cubic cm on 08/30/2019. This could be associated with a right ovarian remnant or simply be a benign adnexal cyst. There is no nodularity or abnormal enhancement to raise concern for malignancy. 2. Left ovarian cystic lesion 1.4 by 1.1 by 1.5 cm in diameter, simple appearance, potentially a small benign simple ovarian cyst or follicle. 3. Prominent stool throughout the colon favors constipation. 4. Spondylosis and degenerative disc disease at L5-L5 causing left greater than right foraminal impingement.   Electronically Signed   By: Gaylyn Rong M.D.   On: 12/02/2019  10:50  Assessment: pt sable  Plan:  1. Encounter for screening mammogram for breast cancer - MM Digital Screening; Future  2. Lower abdominal pain I d/w her that s/s could be related to GYN issues and that I recommend a 32m trial of depo. If no change in s/s, then likely not related to GYN but if some improvement then can continue on depo. Pt amenable to plan. First injection today - medroxyPROGESTERone (DEPO-PROVERA) injection 150 mg   RTC: 89m for visit and possible rpt depo provera injection.   Cornelia Copa MD Attending Center for [employer] [facility])

## 2020-01-12 ENCOUNTER — Other Ambulatory Visit: Payer: Self-pay | Admitting: Family Medicine

## 2020-01-12 ENCOUNTER — Telehealth: Payer: Self-pay | Admitting: Clinical

## 2020-01-12 NOTE — Telephone Encounter (Signed)
[facility]ated [facility] Case Management Referral Note  01/12/2020 Name: Sheri May MRN: [id number] DOB: 03-Jun-1976 Sheri May is a 44 y.o. year old female who sees Kallie Locks, FNP for primary care. LCSW was consulted to assess patient's access to community resources and assist the patient with Transportation Needs .  Assessment: Patient experiencing Financial constraints related to transportation. CSW received a call from [facility] department, indicating that patient had requested assistance with transportation to rheumatology appointment tomorrow in [place]. Patient's PCP in our office made the referral. Patient does not have other transportation options and receives limited income through social security disability. Her daughter usually drives her to appointments but her daughter is having car trouble.   Consulted with Research officer, political party and with patient's PCP. Due to high cost of transport out to [employer], our clinic will not be able to cover the transportation. PCP was able to find another rheumatologist here in [place] that will take patient's insurance. Offered patient the option of making a new referral. Patient indicated she would like to try to find her own way to the appointment tomorrow rather than take the new referral. She is advised to call our office if she is unable to make it to that appointment and PCP can make the new referral.   Review of patient status, including review of consultants reports, relevant laboratory and other test results, and collaboration with appropriate care team members and the patient's provider was performed as part of comprehensive patient evaluation and provision of services.    SDOH (Social Determinants of Health) assessments performed: No  Goals Addressed   None      Follow up Plan: 1. CSW available to coordinate with PCP for new referral if needed  Abigail Butts, LCSW [facility] [phone]

## 2020-01-31 ENCOUNTER — Other Ambulatory Visit: Payer: Self-pay | Admitting: Family Medicine

## 2020-01-31 DIAGNOSIS — R1084 Generalized abdominal pain: Secondary | ICD-10-CM

## 2020-04-17 ENCOUNTER — Other Ambulatory Visit: Payer: Self-pay | Admitting: Family Medicine

## 2020-04-17 DIAGNOSIS — K2100 Gastro-esophageal reflux disease with esophagitis, without bleeding: Secondary | ICD-10-CM

## 2020-05-14 ENCOUNTER — Other Ambulatory Visit: Payer: Self-pay | Admitting: Family Medicine

## 2020-05-14 DIAGNOSIS — E559 Vitamin D deficiency, unspecified: Secondary | ICD-10-CM

## 2020-06-09 ENCOUNTER — Other Ambulatory Visit: Payer: Self-pay | Admitting: Family Medicine

## 2020-06-09 DIAGNOSIS — R1084 Generalized abdominal pain: Secondary | ICD-10-CM

## 2020-06-14 ENCOUNTER — Telehealth: Payer: Self-pay | Admitting: Family Medicine

## 2020-06-14 NOTE — Telephone Encounter (Signed)
Called Pt ask left Vm for Pt to call the office to schedule a appointment for 3 months

## 2020-07-01 ENCOUNTER — Ambulatory Visit: Payer: Medicare Other | Admitting: Family Medicine

## 2020-07-26 ENCOUNTER — Ambulatory Visit (INDEPENDENT_AMBULATORY_CARE_PROVIDER_SITE_OTHER): Payer: Medicare Other | Admitting: Family Medicine

## 2020-07-26 ENCOUNTER — Encounter: Payer: Self-pay | Admitting: Family Medicine

## 2020-07-26 ENCOUNTER — Other Ambulatory Visit: Payer: Self-pay

## 2020-07-26 VITALS — Wt 232.0 lb

## 2020-07-26 DIAGNOSIS — K2100 Gastro-esophageal reflux disease with esophagitis, without bleeding: Secondary | ICD-10-CM | POA: Diagnosis not present

## 2020-07-26 DIAGNOSIS — R1084 Generalized abdominal pain: Secondary | ICD-10-CM

## 2020-07-26 DIAGNOSIS — F419 Anxiety disorder, unspecified: Secondary | ICD-10-CM

## 2020-07-26 DIAGNOSIS — R0602 Shortness of breath: Secondary | ICD-10-CM

## 2020-07-26 DIAGNOSIS — Z760 Encounter for issue of repeat prescription: Secondary | ICD-10-CM

## 2020-07-26 DIAGNOSIS — E538 Deficiency of other specified B group vitamins: Secondary | ICD-10-CM

## 2020-07-26 DIAGNOSIS — Z0000 Encounter for general adult medical examination without abnormal findings: Secondary | ICD-10-CM

## 2020-07-26 DIAGNOSIS — E559 Vitamin D deficiency, unspecified: Secondary | ICD-10-CM

## 2020-07-26 DIAGNOSIS — Z09 Encounter for follow-up examination after completed treatment for conditions other than malignant neoplasm: Secondary | ICD-10-CM

## 2020-07-26 DIAGNOSIS — J302 Other seasonal allergic rhinitis: Secondary | ICD-10-CM | POA: Diagnosis not present

## 2020-07-26 LAB — POCT URINALYSIS DIPSTICK
Bilirubin, UA: NEGATIVE
Blood, UA: NEGATIVE
Glucose, UA: NEGATIVE
Ketones, UA: NEGATIVE
Leukocytes, UA: NEGATIVE
Nitrite, UA: NEGATIVE
Protein, UA: NEGATIVE
Spec Grav, UA: 1.02 (ref 1.010–1.025)
Urobilinogen, UA: 0.2 E.U./dL
pH, UA: 6.5 (ref 5.0–8.0)

## 2020-07-26 MED ORDER — BUDESONIDE-FORMOTEROL FUMARATE 160-4.5 MCG/ACT IN AERO: 2.0000 | INHALATION_SPRAY | Freq: Two times a day (BID) | RESPIRATORY_TRACT | 11 refills | Status: DC

## 2020-07-26 MED ORDER — MONTELUKAST SODIUM 10 MG PO TABS: 10.0000 mg | ORAL_TABLET | Freq: Every day | ORAL | 11 refills | Status: DC

## 2020-07-26 MED ORDER — FLUTICASONE PROPIONATE 50 MCG/ACT NA SUSP: 2.0000 | Freq: Every day | NASAL | 6 refills | Status: DC

## 2020-07-26 MED ORDER — ALBUTEROL SULFATE HFA 108 (90 BASE) MCG/ACT IN AERS: 1.0000 | INHALATION_SPRAY | RESPIRATORY_TRACT | 11 refills | Status: AC | PRN

## 2020-07-26 MED ORDER — BUSPIRONE HCL 15 MG PO TABS: 15.0000 mg | ORAL_TABLET | Freq: Two times a day (BID) | ORAL | 6 refills | Status: AC

## 2020-07-26 MED ORDER — HYDROXYZINE HCL 10 MG PO TABS: 10.0000 mg | ORAL_TABLET | Freq: Three times a day (TID) | ORAL | 3 refills | Status: AC | PRN

## 2020-07-26 MED ORDER — FAMOTIDINE 20 MG PO TABS: ORAL_TABLET | ORAL | 3 refills | Status: DC

## 2020-07-26 MED ORDER — CYCLOBENZAPRINE HCL 10 MG PO TABS
10.0000 mg | ORAL_TABLET | Freq: Two times a day (BID) | ORAL | 6 refills | Status: DC | PRN
Start: 2020-07-26 — End: 2022-09-13

## 2020-07-26 MED ORDER — CETIRIZINE HCL 10 MG PO TABS: 10.0000 mg | ORAL_TABLET | Freq: Every day | ORAL | 11 refills | Status: DC

## 2020-07-26 MED ORDER — LEVALBUTEROL TARTRATE 45 MCG/ACT IN AERO: 2.0000 | INHALATION_SPRAY | Freq: Three times a day (TID) | RESPIRATORY_TRACT | 11 refills | Status: AC | PRN

## 2020-07-26 NOTE — Progress Notes (Signed)
[facility]  Annual Physical   Subjective:  Patient ID: Sheri May, female    DOB: May 16, 1976  Age: 44 y.o. MRN: [id number]  CC: No chief complaint on file.   HPI Sheri May is a 44 year old female who presents for Annual Physical today.  Current Status: Since her last office visit, she has c/o headaches X 1 week now. She is currently taking Excedrine Migraine and Allergy medications. She states that she is drinking plenty of water.  She denies fevers, chills, fatigue, recent infections, weight loss, and night sweats. She has not had any headaches, visual changes, dizziness, and falls. No chest pain, heart palpitations, cough and shortness of breath reported. Denies GI problems such as nausea, vomiting, diarrhea, and constipation. She has no reports of blood in stools, dysuria and hematuria. No depression or anxiety reported today. She is taking all medications as prescribed.    Past Medical History:  Diagnosis Date  . Anxiety 09/2019  . Arthritis   . Asthma   . Bone spur of left foot   . Fibromyalgia   . GERD without esophagitis 07/2019  . H/O scleroderma   . History of uterine fibroid    removal 2013  . Homelessness 09/2019  . Hyperlipidemia 07/2019  . Systemic sclerosis (HCC)   . Vitamin D deficiency 07/2019    Past Surgical History:  Procedure Laterality Date  . CARPAL TUNNEL RELEASE Bilateral   . CESAREAN SECTION  1997  . ENDOMETRIAL ABLATION  2003  . KNEE SURGERY Left    plate in left knee  . LAPAROSCOPIC SUPRACERVICAL HYSTERECTOMY  2012   Bleeding, pain  . MYOMECTOMY     hysteroscopy, at time of ablation  . NASAL SEPTUM SURGERY    . TUBAL LIGATION     postpartum after VBAC    Family History  Problem Relation Age of Onset  . Hypertension Mother   . Fibromyalgia Mother   . Hypertension Father   . Multiple sclerosis Brother     Social History   Socioeconomic History  . Marital status: Single     Spouse name: Not on file  . Number of children: Not on file  . Years of education: Not on file  . Highest education level: Not on file  Occupational History  . Not on file  Tobacco Use  . Smoking status: Light Tobacco Smoker  . Smokeless tobacco: Never Used  Vaping Use  . Vaping Use: Never used  Substance and Sexual Activity  . Alcohol use: Not Currently  . Drug use: Not Currently  . Sexual activity: Not Currently    Partners: Male    Birth control/protection: Condom  Other Topics Concern  . Not on file  Social History Narrative  . Not on file   Social Determinants of Health   Financial Resource Strain:   . Difficulty of Paying Living Expenses: Not on file  Food Insecurity:   . Worried About Programme researcher, broadcasting/film/video in the Last Year: Not on file  . Ran Out of Food in the Last Year: Not on file  Transportation Needs:   . Lack of Transportation (Medical): Not on file  . Lack of Transportation (Non-Medical): Not on file  Physical Activity:   . Days of Exercise per Week: Not on file  . Minutes of Exercise per Session: Not on file  Stress:   . Feeling of Stress : Not on file  Social Connections:   . Frequency  of Communication with Friends and Family: Not on file  . Frequency of Social Gatherings with Friends and Family: Not on file  . Attends Religious Services: Not on file  . Active Member of Clubs or Organizations: Not on file  . Attends Banker Meetings: Not on file  . Marital Status: Not on file  Intimate Partner Violence:   . Fear of Current or Ex-Partner: Not on file  . Emotionally Abused: Not on file  . Physically Abused: Not on file  . Sexually Abused: Not on file    Outpatient Medications Prior to Visit  Medication Sig Dispense Refill  . aspirin-acetaminophen-caffeine (EXCEDRIN MIGRAINE) 250-250-65 MG tablet Take 2 tablets by mouth every 6 (six) hours as needed for headache or migraine.    . folic acid (FOLVITE) 1 MG tablet Take 1 mg by mouth every  morning.    . methotrexate (RHEUMATREX) 2.5 MG tablet Take 17.5 mg by mouth every Sunday. 7 tablets - 17.5 mg    . Multiple Vitamin (MULTIVITAMIN) capsule Take 1 capsule by mouth daily.    . naproxen (NAPROSYN) 500 MG tablet TAKE 1 TABLET(500 MG) BY MOUTH TWICE DAILY AS NEEDED FOR PAIN 60 tablet 3  . Olopatadine HCl 0.2 % SOLN Place 1-2 drops into both eyes daily.    Marland Kitchen omalizumab Geoffry Paradise) 150 MG/ML prefilled syringe Inject 375 mg into the skin every 14 (fourteen) days.    . Vitamin D, Ergocalciferol, (DRISDOL) 1.25 MG (50000 UT) CAPS capsule Take 1 capsule (50,000 Units total) by mouth every 7 (seven) days. 5 capsule 6  . albuterol (VENTOLIN HFA) 108 (90 Base) MCG/ACT inhaler Inhale 1-2 puffs into the lungs every 4 (four) hours as needed.    . budesonide-formoterol (SYMBICORT) 160-4.5 MCG/ACT inhaler Inhale 2 puffs into the lungs 2 (two) times daily.    . busPIRone (BUSPAR) 15 MG tablet Take 1 tablet (15 mg total) by mouth 2 (two) times daily. 60 tablet 6  . cetirizine (ZYRTEC) 10 MG tablet Take 1 tablet (10 mg total) by mouth daily. 30 tablet 11  . cyclobenzaprine (FLEXERIL) 10 MG tablet Take 10 mg by mouth 2 (two) times daily as needed for spasms.    . famotidine (PEPCID) 20 MG tablet TAKE 1 TABLET(20 MG) BY MOUTH DAILY 30 tablet 3  . levalbuterol (XOPENEX HFA) 45 MCG/ACT inhaler Inhale 2 puffs into the lungs every 8 (eight) hours as needed for wheezing or shortness of breath. 1 Inhaler 11  . montelukast (SINGULAIR) 10 MG tablet Take 10 mg by mouth at bedtime.     No facility-administered medications prior to visit.    Allergies  Allergen Reactions  . Ace Inhibitors     cough    ROS Review of Systems  Eyes: Negative.   Respiratory: Negative.   Cardiovascular: Negative.   Gastrointestinal: Negative.   Endocrine: Negative.   Genitourinary: Negative.   Musculoskeletal: Positive for arthralgias (generalized).  Skin: Negative.   Allergic/Immunologic: Negative.   Neurological:  Positive for dizziness (occasional) and headaches (occasional).  Hematological: Negative.   Psychiatric/Behavioral: Negative.       Objective:    Physical Exam Vitals and nursing note reviewed.  Constitutional:      Appearance: Normal appearance.  HENT:     Head: Normocephalic and atraumatic.     Nose: Nose normal.     Mouth/Throat:     Mouth: Mucous membranes are moist.     Pharynx: Oropharynx is clear.  Cardiovascular:     Rate and Rhythm: Normal  rate and regular rhythm.     Pulses: Normal pulses.     Heart sounds: Normal heart sounds.  Pulmonary:     Effort: Pulmonary effort is normal.     Breath sounds: Normal breath sounds.  Abdominal:     General: Bowel sounds are normal. There is distension (occasional ).     Palpations: Abdomen is soft.  Musculoskeletal:        General: Normal range of motion.     Cervical back: Normal range of motion and neck supple.  Skin:    General: Skin is warm and dry.  Neurological:     General: No focal deficit present.     Mental Status: She is alert and oriented to person, place, and time.  Psychiatric:        Mood and Affect: Mood normal.        Behavior: Behavior normal.        Thought Content: Thought content normal.        Judgment: Judgment normal.    Wt 232 lb (105.2 kg)   BMI 42.43 kg/m  Wt Readings from Last 3 Encounters:  07/26/20 232 lb (105.2 kg)  01/11/20 234 lb 6.4 oz (106.3 kg)  01/01/20 227 lb 12.8 oz (103.3 kg)    Health Maintenance Due  Topic Date Due  . TETANUS/TDAP  Never done  . INFLUENZA VACCINE  04/17/2020    There are no preventive care reminders to display for this patient.  Lab Results  Component Value Date   TSH 1.130 08/03/2019   Lab Results  Component Value Date   WBC 5.6 08/30/2019   HGB 13.2 08/30/2019   HCT 41.7 08/30/2019   MCV 77.2 (L) 08/30/2019   PLT 313 08/30/2019   Lab Results  Component Value Date   NA 139 08/30/2019   K 3.9 08/30/2019   CO2 26 08/30/2019   GLUCOSE 93  08/30/2019   BUN 8 08/30/2019   CREATININE 0.82 08/30/2019   BILITOT 0.7 08/30/2019   ALKPHOS 59 08/30/2019   AST 16 08/30/2019   ALT 11 08/30/2019   PROT 7.1 08/30/2019   ALBUMIN 4.0 08/30/2019   CALCIUM 9.1 08/30/2019   ANIONGAP 8 08/30/2019   Lab Results  Component Value Date   CHOL 312 (H) 08/03/2019   Lab Results  Component Value Date   HDL 68 08/03/2019   Lab Results  Component Value Date   LDLCALC 216 (H) 08/03/2019   Lab Results  Component Value Date   TRIG 151 (H) 08/03/2019   Lab Results  Component Value Date   CHOLHDL 4.6 (H) 08/03/2019   No results found for: HGBA1C    Assessment & Plan:   1. Generalized abdominal pain - CBC with Differential - Urinalysis Dipstick  2. Seasonal allergies - fluticasone (FLONASE) 50 MCG/ACT nasal spray; Place 2 sprays into both nostrils daily.  Dispense: 16 g; Refill: 6 - hydrOXYzine (ATARAX/VISTARIL) 10 MG tablet; Take 1 tablet (10 mg total) by mouth 3 (three) times daily as needed.  Dispense: 90 tablet; Refill: 3 - levalbuterol (XOPENEX HFA) 45 MCG/ACT inhaler; Inhale 2 puffs into the lungs every 8 (eight) hours as needed for wheezing or shortness of breath.  Dispense: 15 each; Refill: 11  3. Anxiety - busPIRone (BUSPAR) 15 MG tablet; Take 1 tablet (15 mg total) by mouth 2 (two) times daily.  Dispense: 60 tablet; Refill: 6  4. Gastroesophageal reflux disease with esophagitis without hemorrhage - cetirizine (ZYRTEC) 10 MG tablet; Take 1 tablet (10  mg total) by mouth daily.  Dispense: 30 tablet; Refill: 11 - famotidine (PEPCID) 20 MG tablet; TAKE 1 TABLET(20 MG) BY MOUTH DAILY  Dispense: 30 tablet; Refill: 3  5. Shortness of breath - albuterol (VENTOLIN HFA) 108 (90 Base) MCG/ACT inhaler; Inhale 1-2 puffs into the lungs every 4 (four) hours as needed.  Dispense: 8 each; Refill: 11 - levalbuterol (XOPENEX HFA) 45 MCG/ACT inhaler; Inhale 2 puffs into the lungs every 8 (eight) hours as needed for wheezing or shortness of  breath.  Dispense: 15 each; Refill: 11 - montelukast (SINGULAIR) 10 MG tablet; Take 1 tablet (10 mg total) by mouth at bedtime.  Dispense: 30 tablet; Refill: 11  6. Vitamin D deficiency - Vitamin D, 25-hydroxy  7. Vitamin B12 deficiency - Vitamin B12  8. Healthcare maintenance - Comprehensive metabolic panel - Lipid Panel - TSH - STD Screen (8)  9. Medication refill  10. Follow up She will follow up in 6 months.   Meds ordered this encounter  Medications  . fluticasone (FLONASE) 50 MCG/ACT nasal spray    Sig: Place 2 sprays into both nostrils daily.    Dispense:  16 g    Refill:  6  . hydrOXYzine (ATARAX/VISTARIL) 10 MG tablet    Sig: Take 1 tablet (10 mg total) by mouth 3 (three) times daily as needed.    Dispense:  90 tablet    Refill:  3  . albuterol (VENTOLIN HFA) 108 (90 Base) MCG/ACT inhaler    Sig: Inhale 1-2 puffs into the lungs every 4 (four) hours as needed.    Dispense:  8 each    Refill:  11  . budesonide-formoterol (SYMBICORT) 160-4.5 MCG/ACT inhaler    Sig: Inhale 2 puffs into the lungs 2 (two) times daily.    Dispense:  1 each    Refill:  11  . busPIRone (BUSPAR) 15 MG tablet    Sig: Take 1 tablet (15 mg total) by mouth 2 (two) times daily.    Dispense:  60 tablet    Refill:  6  . cetirizine (ZYRTEC) 10 MG tablet    Sig: Take 1 tablet (10 mg total) by mouth daily.    Dispense:  30 tablet    Refill:  11  . cyclobenzaprine (FLEXERIL) 10 MG tablet    Sig: Take 1 tablet (10 mg total) by mouth 2 (two) times daily as needed.    Dispense:  30 tablet    Refill:  6  . famotidine (PEPCID) 20 MG tablet    Sig: TAKE 1 TABLET(20 MG) BY MOUTH DAILY    Dispense:  30 tablet    Refill:  3  . levalbuterol (XOPENEX HFA) 45 MCG/ACT inhaler    Sig: Inhale 2 puffs into the lungs every 8 (eight) hours as needed for wheezing or shortness of breath.    Dispense:  15 each    Refill:  11  . montelukast (SINGULAIR) 10 MG tablet    Sig: Take 1 tablet (10 mg total) by  mouth at bedtime.    Dispense:  30 tablet    Refill:  11    Orders Placed This Encounter  Procedures  . CBC with Differential  . Comprehensive metabolic panel  . Lipid Panel  . TSH  . Vitamin B12  . Vitamin D, 25-hydroxy  . STD Screen (8)  . Urinalysis Dipstick    Referral Orders  No referral(s) requested today    Raliegh IpNatalie Kiri Hinderliter,  MSN, FNP-BC [facility] [address] [place], [place] [address] [phone]   Problem List Items Addressed This Visit      Digestive   Gastroesophageal reflux disease with esophagitis without hemorrhage   Relevant Medications   cetirizine (ZYRTEC) 10 MG tablet   famotidine (PEPCID) 20 MG tablet     Other   Anxiety   Relevant Medications   hydrOXYzine (ATARAX/VISTARIL) 10 MG tablet   busPIRone (BUSPAR) 15 MG tablet   Shortness of breath   Relevant Medications   albuterol (VENTOLIN HFA) 108 (90 Base) MCG/ACT inhaler   levalbuterol (XOPENEX HFA) 45 MCG/ACT inhaler   montelukast (SINGULAIR) 10 MG tablet   Vitamin B12 deficiency   Relevant Orders   Vitamin B12   Vitamin D deficiency   Relevant Orders   Vitamin D, 25-hydroxy    Other Visit Diagnoses    Generalized abdominal pain    -  Primary   Relevant Orders   CBC with Differential   Urinalysis Dipstick (Completed)   Seasonal allergies       Relevant Medications   fluticasone (FLONASE) 50 MCG/ACT nasal spray   hydrOXYzine (ATARAX/VISTARIL) 10 MG tablet   levalbuterol (XOPENEX HFA) 45 MCG/ACT inhaler   Healthcare maintenance       Relevant Orders   Comprehensive metabolic panel   Lipid Panel   TSH   STD Screen (8)   Medication refill       Follow up          Meds ordered this encounter  Medications  . fluticasone (FLONASE) 50 MCG/ACT nasal spray    Sig: Place 2 sprays into both nostrils daily.    Dispense:  16 g    Refill:  6  . hydrOXYzine (ATARAX/VISTARIL)  10 MG tablet    Sig: Take 1 tablet (10 mg total) by mouth 3 (three) times daily as needed.    Dispense:  90 tablet    Refill:  3  . albuterol (VENTOLIN HFA) 108 (90 Base) MCG/ACT inhaler    Sig: Inhale 1-2 puffs into the lungs every 4 (four) hours as needed.    Dispense:  8 each    Refill:  11  . budesonide-formoterol (SYMBICORT) 160-4.5 MCG/ACT inhaler    Sig: Inhale 2 puffs into the lungs 2 (two) times daily.    Dispense:  1 each    Refill:  11  . busPIRone (BUSPAR) 15 MG tablet    Sig: Take 1 tablet (15 mg total) by mouth 2 (two) times daily.    Dispense:  60 tablet    Refill:  6  . cetirizine (ZYRTEC) 10 MG tablet    Sig: Take 1 tablet (10 mg total) by mouth daily.    Dispense:  30 tablet    Refill:  11  . cyclobenzaprine (FLEXERIL) 10 MG tablet    Sig: Take 1 tablet (10 mg total) by mouth 2 (two) times daily as needed.    Dispense:  30 tablet    Refill:  6  . famotidine (PEPCID) 20 MG tablet    Sig: TAKE 1 TABLET(20 MG) BY MOUTH DAILY    Dispense:  30 tablet    Refill:  3  . levalbuterol (XOPENEX HFA) 45 MCG/ACT inhaler    Sig: Inhale 2 puffs into the lungs every 8 (eight) hours as needed for wheezing or shortness of breath.    Dispense:  15 each    Refill:  11  . montelukast (SINGULAIR) 10 MG tablet  Sig: Take 1 tablet (10 mg total) by mouth at bedtime.    Dispense:  30 tablet    Refill:  11    Follow-up: Return in about 6 months (around 01/23/2021).    Kallie Locks, FNP

## 2020-07-27 ENCOUNTER — Encounter: Payer: Self-pay | Admitting: Family Medicine

## 2020-07-27 ENCOUNTER — Other Ambulatory Visit: Payer: Self-pay | Admitting: Family Medicine

## 2020-07-27 DIAGNOSIS — E559 Vitamin D deficiency, unspecified: Secondary | ICD-10-CM

## 2020-07-27 DIAGNOSIS — E785 Hyperlipidemia, unspecified: Secondary | ICD-10-CM

## 2020-07-27 LAB — CBC WITH DIFFERENTIAL/PLATELET
Basophils Absolute: 0.1 10*3/uL (ref 0.0–0.2)
Basos: 1 %
EOS (ABSOLUTE): 0.2 10*3/uL (ref 0.0–0.4)
Eos: 3 %
Hematocrit: 42.6 % (ref 34.0–46.6)
Hemoglobin: 13.5 g/dL (ref 11.1–15.9)
Immature Grans (Abs): 0 10*3/uL (ref 0.0–0.1)
Immature Granulocytes: 0 %
Lymphocytes Absolute: 2 10*3/uL (ref 0.7–3.1)
Lymphs: 42 %
MCH: 24.2 pg — ABNORMAL LOW (ref 26.6–33.0)
MCHC: 31.7 g/dL (ref 31.5–35.7)
MCV: 77 fL — ABNORMAL LOW (ref 79–97)
Monocytes Absolute: 0.4 10*3/uL (ref 0.1–0.9)
Monocytes: 7 %
Neutrophils Absolute: 2.2 10*3/uL (ref 1.4–7.0)
Neutrophils: 47 %
Platelets: 333 10*3/uL (ref 150–450)
RBC: 5.57 x10E6/uL — ABNORMAL HIGH (ref 3.77–5.28)
RDW: 12.7 % (ref 11.7–15.4)
WBC: 4.9 10*3/uL (ref 3.4–10.8)

## 2020-07-27 LAB — COMPREHENSIVE METABOLIC PANEL
ALT: 12 IU/L (ref 0–32)
AST: 16 IU/L (ref 0–40)
Albumin/Globulin Ratio: 1.6 (ref 1.2–2.2)
Albumin: 4.5 g/dL (ref 3.8–4.8)
Alkaline Phosphatase: 76 IU/L (ref 44–121)
BUN/Creatinine Ratio: 13 (ref 9–23)
BUN: 10 mg/dL (ref 6–24)
Bilirubin Total: 0.2 mg/dL (ref 0.0–1.2)
CO2: 23 mmol/L (ref 20–29)
Calcium: 9.3 mg/dL (ref 8.7–10.2)
Chloride: 103 mmol/L (ref 96–106)
Creatinine, Ser: 0.78 mg/dL (ref 0.57–1.00)
GFR calc Af Amer: 108 mL/min/{1.73_m2} (ref >59)
GFR calc non Af Amer: 93 mL/min/{1.73_m2} (ref >59)
Globulin, Total: 2.9 g/dL (ref 1.5–4.5)
Glucose: 99 mg/dL (ref 65–99)
Potassium: 4.2 mmol/L (ref 3.5–5.2)
Sodium: 140 mmol/L (ref 134–144)
Total Protein: 7.4 g/dL (ref 6.0–8.5)

## 2020-07-27 LAB — STD SCREEN (8)
HIV Screen 4th Generation wRfx: NONREACTIVE
HSV 1 Glycoprotein G Ab, IgG: <0.91 index (ref 0.00–0.90)
HSV 2 IgG, Type Spec: 10.9 index — ABNORMAL HIGH (ref 0.00–0.90)
Hep A IgM: NEGATIVE
Hep B C IgM: NEGATIVE
Hep C Virus Ab: <0.1 s/co ratio (ref 0.0–0.9)
Hepatitis B Surface Ag: NEGATIVE
RPR Ser Ql: NONREACTIVE

## 2020-07-27 LAB — LIPID PANEL
Chol/HDL Ratio: 4.5 ratio — ABNORMAL HIGH (ref 0.0–4.4)
Cholesterol, Total: 263 mg/dL — ABNORMAL HIGH (ref 100–199)
HDL: 59 mg/dL (ref >39)
LDL Chol Calc (NIH): 180 mg/dL — ABNORMAL HIGH (ref 0–99)
Triglycerides: 131 mg/dL (ref 0–149)
VLDL Cholesterol Cal: 24 mg/dL (ref 5–40)

## 2020-07-27 LAB — VITAMIN B12: Vitamin B-12: 273 pg/mL (ref 232–1245)

## 2020-07-27 LAB — TSH: TSH: 0.717 u[IU]/mL (ref 0.450–4.500)

## 2020-07-27 LAB — VITAMIN D 25 HYDROXY (VIT D DEFICIENCY, FRACTURES): Vit D, 25-Hydroxy: 24.3 ng/mL — ABNORMAL LOW (ref 30.0–100.0)

## 2020-07-27 MED ORDER — ATORVASTATIN CALCIUM 10 MG PO TABS: 10.0000 mg | ORAL_TABLET | Freq: Every day | ORAL | 3 refills | Status: DC

## 2020-07-27 MED ORDER — VITAMIN D (ERGOCALCIFEROL) 1.25 MG (50000 UNIT) PO CAPS: 50000.0000 [IU] | ORAL_CAPSULE | ORAL | 3 refills | Status: DC

## 2020-07-27 MED ORDER — VITAMIN D (ERGOCALCIFEROL) 1.25 MG (50000 UNIT) PO CAPS: 50000.0000 [IU] | ORAL_CAPSULE | ORAL | 6 refills | Status: DC

## 2020-07-27 NOTE — Progress Notes (Signed)
Pt was called to discuss her lab results. Pt states sheunderstood and will keep her f/u appt. I Let pt know I will call her with her other results.

## 2020-08-15 ENCOUNTER — Telehealth: Payer: Self-pay | Admitting: Clinical

## 2020-08-15 NOTE — Telephone Encounter (Signed)
Integrated Behavioral Health Case Management Referral Note  08/15/2020 Name: Logann Whitebread MRN: [id number] DOB: [birth year] Sheri May is a 44 y.o. year old female who sees Kallie Locks, FNP for primary care. LCSW was consulted to assess patient's needs and assist the patient with Housing and Financial Difficulties related to low income.  Interpreter: No.   Interpreter Name & Language: none  Assessment: Patient experiencing Housing barriers. Patient has limited income from social security disability. She is unable to stay with family. She is temporarily staying in a hotel but cannot afford it. Patient called CSW today and requested assistance with housing resources.   Intervention: CSW provided patient information on the [employer] [facility]) and sent referral to [facility] via [id number]. Advised patient also walk-in to try to see a caseworker, as she may not receive follow up via the referral for a few weeks.Advised patient that emergency winter shelter may be available and accessible through the [facility], and for this reason advised patient walk-in to access services.  Also referred patient to [employer] via [id number].   Provided patient information on the [facility]) which can help with housing and other disability support.   Patient may also want to apply for [facility] though they often have a long wait list. Provided patient with application.   Provided patient with shelter listings.  Also provided patient with information on mental health services in [place]. Patient reported her daughter is helping her connect with one of the online/virtaul counseling services. Advised that patient ,may also seek walk-in services at [facility]) and can call her PCP here at the [facility]) for a referral for mental health counseling if needed.  Review of patient status, including review of consultants  reports, relevant laboratory and other test results, and collaboration with appropriate care team members and the patient's provider was performed as part of comprehensive patient evaluation and provision of services.    SDOH (Social Determinants of Health) assessments performed: No Informal assessment of SDOH. Patient unstably housed, staying in a hotel temporarily but cannot afford it. Does not have reliable transportation. Patient currently not staying in a hotel in [place], but her daughter lives in [place] and can provide transportation at times.  Outpatient Encounter Medications as of 08/15/2020  Medication Sig Note  . albuterol (VENTOLIN HFA) 108 (90 Base) MCG/ACT inhaler Inhale 1-2 puffs into the lungs every 4 (four) hours as needed.   Marland Kitchen aspirin-acetaminophen-caffeine (EXCEDRIN MIGRAINE) 250-250-65 MG tablet Take 2 tablets by mouth every 6 (six) hours as needed for headache or migraine. 01/01/2020: As needed.   Marland Kitchen atorvastatin (LIPITOR) 10 MG tablet Take 1 tablet (10 mg total) by mouth daily.   . budesonide-formoterol (SYMBICORT) 160-4.5 MCG/ACT inhaler Inhale 2 puffs into the lungs 2 (two) times daily.   . busPIRone (BUSPAR) 15 MG tablet Take 1 tablet (15 mg total) by mouth 2 (two) times daily.   . cetirizine (ZYRTEC) 10 MG tablet Take 1 tablet (10 mg total) by mouth daily.   . cyclobenzaprine (FLEXERIL) 10 MG tablet Take 1 tablet (10 mg total) by mouth 2 (two) times daily as needed.   . famotidine (PEPCID) 20 MG tablet TAKE 1 TABLET(20 MG) BY MOUTH DAILY   . fluticasone (FLONASE) 50 MCG/ACT nasal spray Place 2 sprays into both nostrils daily.   . folic acid (FOLVITE) 1 MG tablet Take 1 mg by mouth every morning.   . hydrOXYzine (ATARAX/VISTARIL) 10 MG tablet Take 1  tablet (10 mg total) by mouth 3 (three) times daily as needed.   . levalbuterol (XOPENEX HFA) 45 MCG/ACT inhaler Inhale 2 puffs into the lungs every 8 (eight) hours as needed for wheezing or shortness of breath.   .  methotrexate (RHEUMATREX) 2.5 MG tablet Take 17.5 mg by mouth every Sunday. 7 tablets - 17.5 mg   . montelukast (SINGULAIR) 10 MG tablet Take 1 tablet (10 mg total) by mouth at bedtime.   . Multiple Vitamin (MULTIVITAMIN) capsule Take 1 capsule by mouth daily.   . naproxen (NAPROSYN) 500 MG tablet TAKE 1 TABLET(500 MG) BY MOUTH TWICE DAILY AS NEEDED FOR PAIN   . Olopatadine HCl 0.2 % SOLN Place 1-2 drops into both eyes daily. 01/01/2020: As needed.   Marland Kitchen omalizumab Geoffry Paradise) 150 MG/ML prefilled syringe Inject 375 mg into the skin every 14 (fourteen) days.   . Vitamin D, Ergocalciferol, (DRISDOL) 1.25 MG (50000 UNIT) CAPS capsule Take 1 capsule (50,000 Units total) by mouth every 7 (seven) days.    No facility-administered encounter medications on file as of 08/15/2020.    Goals Addressed   None      Follow up Plan: 1. CSW available from clinic as needed for assistance with referral follow up  Abigail Butts, LCSW [facility] [phone]

## 2020-09-06 ENCOUNTER — Telehealth: Payer: Self-pay | Admitting: Clinical

## 2020-09-06 NOTE — Telephone Encounter (Signed)
Integrated Behavioral Health Case Management Referral Note  09/06/2020 Name: Sheri May MRN: [id number] DOB: 12-22-1975 Sheri May is a 44 y.o. year old female who sees Kallie Locks, FNP for primary care. LCSW was consulted to assess patient's needs and assist the patient with Housing and Financial Difficulties related to low income.  Interpreter: No.   Interpreter Name & Language: none  Assessment: This was an unsuccessful contact. Patient experiencing Housing barriers.   Intervention: CSW called patient on 09/02/20 and 09/05/20 and LVM at both calls. Have not received return call. LVM regarding updates to winter shelter provided by the [employer] [facility]) and [employer]. At last contact on 08/15/20, did refer patient to additional community resources.   SDOH (Social Determinants of Health) assessments performed: No   Goals Addressed   None      Follow up Plan: 1. CSW available from clinic as needed   Abigail Butts, LCSW [facility] [phone]

## 2020-11-22 ENCOUNTER — Other Ambulatory Visit: Payer: Self-pay | Admitting: Family Medicine

## 2020-11-22 DIAGNOSIS — K2100 Gastro-esophageal reflux disease with esophagitis, without bleeding: Secondary | ICD-10-CM

## 2021-01-18 ENCOUNTER — Other Ambulatory Visit: Payer: Self-pay

## 2021-01-18 ENCOUNTER — Ambulatory Visit (HOSPITAL_COMMUNITY)
Admission: EM | Admit: 2021-01-18 | Discharge: 2021-01-18 | Disposition: A | Discharge status: Home / Self Care | Payer: Medicare Other | Attending: Family Medicine | Admitting: Family Medicine

## 2021-01-18 ENCOUNTER — Encounter (HOSPITAL_COMMUNITY): Payer: Self-pay

## 2021-01-18 DIAGNOSIS — J0100 Acute maxillary sinusitis, unspecified: Secondary | ICD-10-CM | POA: Diagnosis not present

## 2021-01-18 DIAGNOSIS — J302 Other seasonal allergic rhinitis: Secondary | ICD-10-CM

## 2021-01-18 MED ORDER — AMOXICILLIN 875 MG PO TABS: 875.0000 mg | ORAL_TABLET | Freq: Two times a day (BID) | ORAL | 0 refills | Status: AC

## 2021-01-18 NOTE — ED Triage Notes (Signed)
Pt in with c/o eye puffiness and eye drainage that she noticed this morning  Pt also c/o green mucus when she blows her nose along with some bleeding

## 2021-01-18 NOTE — ED Provider Notes (Signed)
[facility]   [id number] 01/18/21 Arrival Time: 3976  ASSESSMENT & PLAN:  1. Acute non-recurrent maxillary sinusitis   2. Seasonal allergies    Begin: Meds ordered this encounter  Medications  . amoxicillin (AMOXIL) 875 MG tablet    Sig: Take 1 tablet (875 mg total) by mouth 2 (two) times daily for 10 days.    Dispense:  20 tablet    Refill:  0   OTC allergy medication daily. OTC symptom care as needed. Ensure adequate fluid intake and rest.   Follow-up Information    Kallie Locks, FNP.   Specialty: Family Medicine Why: As needed. Contact information: [address] [place] [address] [phone]        [facility].   Specialty: Urgent Care Why: If worsening or failing to improve as anticipated. Contact information: [address] [place] [address] [phone]              Reviewed expectations re: course of current medical issues. Questions answered. Outlined signs and symptoms indicating need for more acute intervention. Patient verbalized understanding. After Visit Summary given.   SUBJECTIVE: History from: patient.  Sheri May is a 45 y.o. female who presents with complaint of nasal congestion, post-nasal drainage, and sinus pain. Seasonal allergy exacerbation; several weeks. Now with maxillary facial pain. Respiratory symptoms: none. Fever: absent. Overall normal PO intake without n/v. OTC treatment: none reported. History of frequent sinus infections: "few times a year sometimes". No specific aggravating or alleviating factors reported.  Social History   Tobacco Use  Smoking Status Light Tobacco Smoker  Smokeless Tobacco Never Used     OBJECTIVE:  Vitals:   01/18/21 1038  BP: 114/75  Pulse: 80  Resp: 18  Temp: 98.7 F (37.1 C)  SpO2: 97%     General appearance: alert; no distress HEENT: nasal congestion; clear runny nose; throat irritation secondary to post-nasal  drainage; bilateral maxillary tenderness to palpation; turbinates boggy Neck: supple without LAD; trachea midline Lungs: unlabored respirations, symmetrical air entry; cough: absent; no respiratory distress Skin: warm and dry Psychological: alert and cooperative; normal mood and affect  Allergies  Allergen Reactions  . Ace Inhibitors     cough    Past Medical History:  Diagnosis Date  . Anxiety 09/2019  . Arthritis   . Asthma   . Bone spur of left foot   . Fibromyalgia   . GERD without esophagitis 07/2019  . H/O scleroderma   . History of uterine fibroid    removal 2013  . Homelessness 09/2019  . Hyperlipidemia 07/2019  . Systemic sclerosis (HCC)   . Vitamin D deficiency 07/2019   Family History  Problem Relation Age of Onset  . Hypertension Mother   . Fibromyalgia Mother   . Hypertension Father   . Multiple sclerosis Brother    Social History   Socioeconomic History  . Marital status: Single    Spouse name: Not on file  . Number of children: Not on file  . Years of education: Not on file  . Highest education level: Not on file  Occupational History  . Not on file  Tobacco Use  . Smoking status: Light Tobacco Smoker  . Smokeless tobacco: Never Used  Vaping Use  . Vaping Use: Never used  Substance and Sexual Activity  . Alcohol use: Not Currently  . Drug use: Not Currently  . Sexual activity: Not Currently    Partners: Male  Birth control/protection: Condom  Other Topics Concern  . Not on file  Social History Narrative  . Not on file   Social Determinants of Health   Financial Resource Strain: Not on file  Food Insecurity: Not on file  Transportation Needs: Not on file  Physical Activity: Not on file  Stress: Not on file  Social Connections: Not on file  Intimate Partner Violence: Not on file            Mardella Layman, MD 01/18/21 1105

## 2021-01-24 ENCOUNTER — Ambulatory Visit: Payer: Medicare Other | Admitting: Family Medicine

## 2021-01-25 ENCOUNTER — Ambulatory Visit: Payer: Medicare Other | Admitting: Nurse Practitioner

## 2021-02-20 ENCOUNTER — Other Ambulatory Visit: Payer: Self-pay | Admitting: Nurse Practitioner

## 2021-02-20 DIAGNOSIS — J302 Other seasonal allergic rhinitis: Secondary | ICD-10-CM

## 2021-02-20 MED ORDER — FLUTICASONE PROPIONATE 50 MCG/ACT NA SUSP: 2.0000 | Freq: Every day | NASAL | 6 refills | Status: DC

## 2021-03-08 ENCOUNTER — Ambulatory Visit (HOSPITAL_COMMUNITY)
Admission: RE | Admit: 2021-03-08 | Discharge: 2021-03-08 | Disposition: A | Discharge status: Home / Self Care | Payer: Medicare Other | Source: Ambulatory Visit | Attending: Nurse Practitioner | Admitting: Nurse Practitioner

## 2021-03-08 ENCOUNTER — Encounter: Payer: Self-pay | Admitting: Nurse Practitioner

## 2021-03-08 ENCOUNTER — Ambulatory Visit (INDEPENDENT_AMBULATORY_CARE_PROVIDER_SITE_OTHER): Payer: Medicare Other | Admitting: Nurse Practitioner

## 2021-03-08 ENCOUNTER — Other Ambulatory Visit: Payer: Self-pay

## 2021-03-08 VITALS — BP 114/61 | HR 84 | Temp 97.9°F | Ht 62.0 in | Wt 241.0 lb

## 2021-03-08 DIAGNOSIS — M5441 Lumbago with sciatica, right side: Secondary | ICD-10-CM | POA: Diagnosis present

## 2021-03-08 DIAGNOSIS — Z6841 Body Mass Index (BMI) 40.0 and over, adult: Secondary | ICD-10-CM

## 2021-03-08 DIAGNOSIS — G8929 Other chronic pain: Secondary | ICD-10-CM

## 2021-03-08 DIAGNOSIS — J3489 Other specified disorders of nose and nasal sinuses: Secondary | ICD-10-CM

## 2021-03-08 MED ORDER — PSEUDOEPHEDRINE-GUAIFENESIN ER 120-1200 MG PO TB12: 1.0000 | ORAL_TABLET | Freq: Two times a day (BID) | ORAL | 0 refills | Status: AC

## 2021-03-08 MED ORDER — SALONPAS PAIN RELIEF PATCH EX PTCH: 1.0000 | MEDICATED_PATCH | Freq: Three times a day (TID) | CUTANEOUS | 11 refills | Status: AC

## 2021-03-08 NOTE — Progress Notes (Signed)
[facility] [address] [place] [place], [place]  [address] Phone:  [phone]   Fax:  [phone]    Acute Office Visit  Subjective:    Patient ID: Sheri BryantLynette May, female    DOB: 05/25/1976, 45 y.o.   MRN: [id number]  Chief Complaint  Patient presents with   Follow-up    Major headaches and pressure. Pulmonary is referring to ENT for sinus and drainage. Right ear pressure bending down and getting up makes her feel off balance, nauseous.  Low back pain.     HPI Patient is in today for an acute visit. She  has a past medical history of Anxiety (09/2019), Arthritis, Asthma, Bone spur of left foot, Fibromyalgia, GERD without esophagitis (07/2019), H/O scleroderma, History of uterine fibroid, Homelessness (09/2019), Hyperlipidemia (07/2019), Systemic sclerosis (HCC), and Vitamin D deficiency (07/2019).   Back Pain Patient presents for evaluation of low back problems. Symptoms have been present for several months. "Hurts like hell" She denies any accident or injury. She does have pain that radiates into her right leg. " Took the one. She has burning sensation; back and side of leg and it goes into her calf. She has weakness and pain in the leg. Alleviating factors identifiable by the patient are none. Aggravating factors identifiable by the patient are  movements and pressure with voiding  . Treatments initiated by the patient: none. OTC medication is not effective.  She reports a history of herniated dics. She is unemployed.. Previous treatments: PT and medications. She thought that this pain was related to urination. She has changed soft drinks and is now drinking water, juice and coffee.  She is trying to eat healthy.    Past Medical History:  Diagnosis Date   Anxiety 09/2019   Arthritis    Asthma    Bone spur of left foot    Fibromyalgia    GERD without esophagitis 07/2019   H/O scleroderma    History of uterine fibroid    removal 2013   Homelessness 09/2019    Hyperlipidemia 07/2019   Systemic sclerosis (HCC)    Vitamin D deficiency 07/2019    Past Surgical History:  Procedure Laterality Date   CARPAL TUNNEL RELEASE Bilateral    CESAREAN SECTION  1997   ENDOMETRIAL ABLATION  2003   KNEE SURGERY Left    plate in left knee   LAPAROSCOPIC SUPRACERVICAL HYSTERECTOMY  2012   Bleeding, pain   MYOMECTOMY     hysteroscopy, at time of ablation   NASAL SEPTUM SURGERY     TUBAL LIGATION     postpartum after VBAC    Family History  Problem Relation Age of Onset   Hypertension Mother    Fibromyalgia Mother    Hypertension Father    Multiple sclerosis Brother     Social History   Socioeconomic History   Marital status: Single    Spouse name: Not on file   Number of children: Not on file   Years of education: Not on file   Highest education level: Not on file  Occupational History   Not on file  Tobacco Use   Smoking status: Light Smoker    Pack years: 0.00   Smokeless tobacco: Never  Vaping Use   Vaping Use: Never used  Substance and Sexual Activity   Alcohol use: Not Currently   Drug use: Not Currently   Sexual activity: Not Currently    Partners: Male    Birth control/protection: Condom  Other  Topics Concern   Not on file  Social History Narrative   Not on file   Social Determinants of Health   Financial Resource Strain: Not on file  Food Insecurity: Not on file  Transportation Needs: Not on file  Physical Activity: Not on file  Stress: Not on file  Social Connections: Not on file  Intimate Partner Violence: Not on file    Outpatient Medications Prior to Visit  Medication Sig Dispense Refill   albuterol (VENTOLIN HFA) 108 (90 Base) MCG/ACT inhaler Inhale 1-2 puffs into the lungs every 4 (four) hours as needed. 8 each 11   aspirin-acetaminophen-caffeine (EXCEDRIN MIGRAINE) 250-250-65 MG tablet Take 2 tablets by mouth every 6 (six) hours as needed for headache or migraine.     atorvastatin (LIPITOR) 10 MG tablet  Take 1 tablet (10 mg total) by mouth daily. 90 tablet 3   budesonide-formoterol (SYMBICORT) 160-4.5 MCG/ACT inhaler Inhale 2 puffs into the lungs 2 (two) times daily. 1 each 11   busPIRone (BUSPAR) 15 MG tablet Take 1 tablet (15 mg total) by mouth 2 (two) times daily. 60 tablet 6   cetirizine (ZYRTEC) 10 MG tablet Take 1 tablet (10 mg total) by mouth daily. 30 tablet 11   famotidine (PEPCID) 20 MG tablet TAKE 1 TABLET(20 MG) BY MOUTH DAILY 90 tablet 1   fluticasone (FLONASE) 50 MCG/ACT nasal spray Place 2 sprays into both nostrils daily. 16 g 6   folic acid (FOLVITE) 1 MG tablet Take 1 mg by mouth every morning.     levalbuterol (XOPENEX HFA) 45 MCG/ACT inhaler Inhale 2 puffs into the lungs every 8 (eight) hours as needed for wheezing or shortness of breath. 15 each 11   montelukast (SINGULAIR) 10 MG tablet Take 1 tablet (10 mg total) by mouth at bedtime. 30 tablet 11   Multiple Vitamin (MULTIVITAMIN) capsule Take 1 capsule by mouth daily.     omalizumab Geoffry Paradise) 150 MG/ML prefilled syringe Inject 375 mg into the skin every 14 (fourteen) days.     Vitamin D, Ergocalciferol, (DRISDOL) 1.25 MG (50000 UNIT) CAPS capsule Take 1 capsule (50,000 Units total) by mouth every 7 (seven) days. 5 capsule 3   cyclobenzaprine (FLEXERIL) 10 MG tablet Take 1 tablet (10 mg total) by mouth 2 (two) times daily as needed. (Patient not taking: Reported on 03/08/2021) 30 tablet 6   hydrOXYzine (ATARAX/VISTARIL) 10 MG tablet Take 1 tablet (10 mg total) by mouth 3 (three) times daily as needed. 90 tablet 3   naproxen (NAPROSYN) 500 MG tablet TAKE 1 TABLET(500 MG) BY MOUTH TWICE DAILY AS NEEDED FOR PAIN (Patient not taking: Reported on 03/08/2021) 60 tablet 3   Olopatadine HCl 0.2 % SOLN Place 1-2 drops into both eyes daily. (Patient not taking: Reported on 03/08/2021)     No facility-administered medications prior to visit.    Allergies  Allergen Reactions   Ace Inhibitors Rash    cough cough cough    Review of  Systems  HENT:  Positive for sinus pressure.       Objective:    Physical Exam Constitutional:      Appearance: She is obese.  HENT:     Head: Normocephalic and atraumatic.  Cardiovascular:     Rate and Rhythm: Normal rate.     Pulses: Normal pulses.  Musculoskeletal:     Cervical back: Normal range of motion.     Lumbar back: Tenderness present. Normal range of motion. Positive right straight leg raise test (> 45 degrees).  Right lower leg: No edema.     Left lower leg: No edema.  Skin:    General: Skin is warm and dry.     Capillary Refill: Capillary refill takes less than 2 seconds.  Neurological:     General: No focal deficit present.     Mental Status: She is alert and oriented to person, place, and time.    BP 114/61   Pulse 84   Temp 97.9 F (36.6 C)   Ht 5\' 2"  (1.575 m)   Wt 241 lb 0.6 oz (109.3 kg)   SpO2 100%   BMI 44.09 kg/m  Wt Readings from Last 3 Encounters:  03/08/21 241 lb 0.6 oz (109.3 kg)  07/26/20 232 lb (105.2 kg)  01/11/20 234 lb 6.4 oz (106.3 kg)    There are no preventive care reminders to display for this patient.   There are no preventive care reminders to display for this patient.   Lab Results  Component Value Date   TSH 0.717 07/26/2020   Lab Results  Component Value Date   WBC 4.9 07/26/2020   HGB 13.5 07/26/2020   HCT 42.6 07/26/2020   MCV 77 (L) 07/26/2020   PLT 333 07/26/2020   Lab Results  Component Value Date   NA 140 07/26/2020   K 4.2 07/26/2020   CO2 23 07/26/2020   GLUCOSE 99 07/26/2020   BUN 10 07/26/2020   CREATININE 0.78 07/26/2020   BILITOT 0.2 07/26/2020   ALKPHOS 76 07/26/2020   AST 16 07/26/2020   ALT 12 07/26/2020   PROT 7.4 07/26/2020   ALBUMIN 4.5 07/26/2020   CALCIUM 9.3 07/26/2020   ANIONGAP 8 08/30/2019   Lab Results  Component Value Date   CHOL 263 (H) 07/26/2020   Lab Results  Component Value Date   HDL 59 07/26/2020   Lab Results  Component Value Date   LDLCALC 180 (H)  07/26/2020   Lab Results  Component Value Date   TRIG 131 07/26/2020   Lab Results  Component Value Date   CHOLHDL 4.5 (H) 07/26/2020   No results found for: HGBA1C     Assessment & Plan:   Problem List Items Addressed This Visit   None Visit Diagnoses     Chronic right-sided low back pain with right-sided sciatica    -  Primary Xray ans PT referral   Relevant Orders   DG Lumbar Spine Complete   Ambulatory referral to Physical Therapy   Sinus pressure     Recommend Mucinex D and continue with current treatment and follow-up with ENT as scheduled   Class 3 severe obesity due to excess calories with body mass index (BMI) of 40.0 to 44.9 in adult, unspecified whether serious comorbidity present (HCC)     Persistent education provided        Meds ordered this encounter  Medications   Menthol-Methyl Salicylate (SALONPAS PAIN RELIEF PATCH) PTCH    Sig: Apply 1 patch topically 3 (three) times daily for 5000 doses.    Dispense:  60 patch    Refill:  11    Order Specific Question:   Supervising Provider    Answer:   13/05/2020 Quentin Angst   Pseudoephedrine-Guaifenesin (MUCINEX D MAX STRENGTH) [phone] MG TB12    Sig: Take 1 tablet by mouth every 12 (twelve) hours for 10 days.    Dispense:  20 tablet    Refill:  0    Order Specific Question:   Supervising Provider  AnswerQuentin Angst [[id number]]     Barbette Merino, NP

## 2021-03-08 NOTE — Patient Instructions (Signed)
Lumbosacral Radiculopathy Lumbosacral radiculopathy is a condition that involves the spinal nerves and nerve roots in the low back and bottom of the spine. The condition develops when these nerves and nerve roots move out of place or become inflamed andcause symptoms. What are the causes? This condition may be caused by: Pressure from a disk that bulges out of place (herniated disk). A disk is a plate of soft cartilage that separates bones in the spine. Disk changes that occur with age (disk degeneration). A narrowing of the bones of the lower back (spinal stenosis). A tumor. An infection. An injury that places sudden pressure on the disks that cushion the bones of your lower spine. What increases the risk? You are more likely to develop this condition if: You are a female who is 30-50 years old. You are a female who is 50-60 years old. You use improper technique when lifting things. You are overweight or live a sedentary lifestyle. You smoke. Your work requires frequent lifting. You do repetitive activities that strain the spine. What are the signs or symptoms? Symptoms of this condition include: Pain that goes down from your back into your legs (sciatica), usually on one side of the body. This is the most common symptom. The pain may be worse with sitting, coughing, or sneezing. Pain and numbness in your legs. Muscle weakness. Tingling. Loss of bladder control or bowel control. How is this diagnosed? This condition may be diagnosed based on: Your symptoms and medical history. A physical exam. If the pain is lasting, you may have tests, such as: MRI scan. X-ray. CT scan. A type of X-ray used to examine the spinal canal after injecting a dye into your spine (myelogram). A test to measure how electrical impulses move through a nerve (nerve conduction study). How is this treated? Treatment may depend on the cause of the condition and may include: Working with a physical  therapist. Taking pain medicine. Applying heat and ice to affected areas. Doing stretches to improve flexibility. Doing exercises to strengthen back muscles. Having chiropractic spinal manipulation. Using transcutaneous electrical nerve stimulation (TENS) therapy. Getting a steroid injection in the spine. In some cases, no treatment is needed. If the condition is long-lasting (chronic), or if symptoms are severe, treatment may involve surgery or lifestylechanges, such as following a weight-loss plan. Follow these instructions at home: Activity Avoid bending and other activities that make the problem worse. Maintain a proper position when standing or sitting: When standing, keep your upper back and neck straight, with your shoulders pulled back. Avoid slouching. When sitting, keep your back straight and relax your shoulders. Do not round your shoulders or pull them backward. Do not sit or stand in one place for long periods of time. Take brief periods of rest throughout the day. This will reduce your pain. It is usually better to rest by lying down or standing, not sitting. When you are resting for longer periods, mix in some mild activity or stretching between periods of rest. This will help to prevent stiffness and pain. Get regular exercise. Ask your health care provider what activities are safe for you. If you were shown how to do any exercises or stretches, do them as directed by your health care provider. Do not lift anything that is heavier than 10 lb (4.5 kg) or the limit that you are told by your health care provider. Always use proper lifting technique, which includes: Bending your knees. Keeping the load close to your body. Avoiding twisting. Managing pain   If directed, put ice on the affected area: Put ice in a plastic bag. Place a towel between your skin and the bag. Leave the ice on for 20 minutes, 2-3 times a day. If directed, apply heat to the affected area as often as told  by your health care provider. Use the heat source that your health care provider recommends, such as a moist heat pack or a heating pad. Place a towel between your skin and the heat source. Leave the heat on for 20-30 minutes. Remove the heat if your skin turns bright red. This is especially important if you are unable to feel pain, heat, or cold. You may have a greater risk of getting burned. Take over-the-counter and prescription medicines only as told by your health care provider. General instructions Sleep on a firm mattress in a comfortable position. Try lying on your side with your knees slightly bent. If you lie on your back, put a pillow under your knees. Do not drive or use heavy machinery while taking prescription pain medicine. If your health care provider prescribed a diet or exercise program, follow it as directed. Keep all follow-up visits as told by your health care provider. This is important. Contact a health care provider if: Your pain does not improve over time, even when taking pain medicines. Get help right away if: You develop severe pain. Your pain suddenly gets worse. You develop increasing weakness in your legs. You lose the ability to control your bladder or bowel. You have difficulty walking or balancing. You have a fever. Summary Lumbosacral radiculopathy is a condition that occurs when the spinal nerves and nerve roots in the lower part of the spine move out of place or become inflamed and cause symptoms. Symptoms include pain, numbness, and tingling that go down from your back into your legs (sciatica), muscle weakness, and loss of bladder control or bowel control. If directed, apply ice or heat to the affected area as told by your health care provider. Follow instructions about activity, rest, and proper lifting technique. This information is not intended to replace advice given to you by your health care provider. Make sure you discuss any questions you have  with your healthcare provider. Document Revised: 07/14/2020 Document Reviewed: 07/14/2020 [employer] Patient Education  2022 [employer]  

## 2021-03-13 ENCOUNTER — Ambulatory Visit (INDEPENDENT_AMBULATORY_CARE_PROVIDER_SITE_OTHER): Payer: Medicare Other | Admitting: Nurse Practitioner

## 2021-03-13 VITALS — BP 128/65 | HR 96 | Temp 97.6°F | Resp 18 | Ht 62.0 in | Wt 243.0 lb

## 2021-03-13 DIAGNOSIS — Z0000 Encounter for general adult medical examination without abnormal findings: Secondary | ICD-10-CM

## 2021-03-13 NOTE — Progress Notes (Signed)
Subjective:   Sheri May is a 45 y.o. female who presents for Medicare Annual (Subsequent) preventive examination.  Review of Systems    Review of Systems  Constitutional: Negative.   HENT: Negative.    Eyes: Negative.   Respiratory: Negative.    Cardiovascular: Negative.   Gastrointestinal: Negative.   Genitourinary: Negative.   Musculoskeletal: Negative.   Skin: Negative.   Neurological: Negative.   Endo/Heme/Allergies: Negative.   Psychiatric/Behavioral: Negative.     Cardiac Risk Factors include: obesity (BMI >30kg/m2);sedentary lifestyle     Objective:    Today's Vitals   03/13/21 1013  BP: 128/65  Pulse: 96  Resp: 18  Temp: 97.6 F (36.4 C)  SpO2: 100%  Weight: 243 lb (110.2 kg)  Height: 5\' 2"  (1.575 m)   Body mass index is 44.45 kg/m.  Advanced Directives 03/13/2021  Does Patient Have a Medical Advance Directive? No  Would patient like information on creating a medical advance directive? No - Patient declined    Current Medications (verified) Outpatient Encounter Medications as of 03/13/2021  Medication Sig   albuterol (VENTOLIN HFA) 108 (90 Base) MCG/ACT inhaler Inhale 1-2 puffs into the lungs every 4 (four) hours as needed.   aspirin-acetaminophen-caffeine (EXCEDRIN MIGRAINE) 250-250-65 MG tablet Take 2 tablets by mouth every 6 (six) hours as needed for headache or migraine.   atorvastatin (LIPITOR) 10 MG tablet Take 1 tablet (10 mg total) by mouth daily.   budesonide-formoterol (SYMBICORT) 160-4.5 MCG/ACT inhaler Inhale 2 puffs into the lungs 2 (two) times daily.   busPIRone (BUSPAR) 15 MG tablet Take 1 tablet (15 mg total) by mouth 2 (two) times daily.   cetirizine (ZYRTEC) 10 MG tablet Take 1 tablet (10 mg total) by mouth daily.   cyclobenzaprine (FLEXERIL) 10 MG tablet Take 1 tablet (10 mg total) by mouth 2 (two) times daily as needed. (Patient not taking: Reported on 03/08/2021)   famotidine (PEPCID) 20 MG tablet TAKE 1 TABLET(20 MG) BY MOUTH  DAILY   fluticasone (FLONASE) 50 MCG/ACT nasal spray Place 2 sprays into both nostrils daily.   folic acid (FOLVITE) 1 MG tablet Take 1 mg by mouth every morning.   hydrOXYzine (ATARAX/VISTARIL) 10 MG tablet Take 1 tablet (10 mg total) by mouth 3 (three) times daily as needed.   levalbuterol (XOPENEX HFA) 45 MCG/ACT inhaler Inhale 2 puffs into the lungs every 8 (eight) hours as needed for wheezing or shortness of breath.   Menthol-Methyl Salicylate (SALONPAS PAIN RELIEF PATCH) PTCH Apply 1 patch topically 3 (three) times daily for 5000 doses.   montelukast (SINGULAIR) 10 MG tablet Take 1 tablet (10 mg total) by mouth at bedtime.   Multiple Vitamin (MULTIVITAMIN) capsule Take 1 capsule by mouth daily.   naproxen (NAPROSYN) 500 MG tablet TAKE 1 TABLET(500 MG) BY MOUTH TWICE DAILY AS NEEDED FOR PAIN (Patient not taking: Reported on 03/08/2021)   Olopatadine HCl 0.2 % SOLN Place 1-2 drops into both eyes daily. (Patient not taking: Reported on 03/08/2021)   omalizumab Geoffry Paradise(XOLAIR) 150 MG/ML prefilled syringe Inject 375 mg into the skin every 14 (fourteen) days.   Pseudoephedrine-Guaifenesin (MUCINEX D MAX STRENGTH) [phone] MG TB12 Take 1 tablet by mouth every 12 (twelve) hours for 10 days.   Vitamin D, Ergocalciferol, (DRISDOL) 1.25 MG (50000 UNIT) CAPS capsule Take 1 capsule (50,000 Units total) by mouth every 7 (seven) days.   [DISCONTINUED] methotrexate (RHEUMATREX) 2.5 MG tablet Take 17.5 mg by mouth every Sunday. 7 tablets - 17.5 mg   No facility-administered encounter  medications on file as of 03/13/2021.    Allergies (verified) Ace inhibitors   History: Past Medical History:  Diagnosis Date   Anxiety 09/2019   Arthritis    Asthma    Bone spur of left foot    Fibromyalgia    GERD without esophagitis 07/2019   H/O scleroderma    History of uterine fibroid    removal 2013   Homelessness 09/2019   Hyperlipidemia 07/2019   Systemic sclerosis (HCC)    Vitamin D deficiency 07/2019   Past  Surgical History:  Procedure Laterality Date   CARPAL TUNNEL RELEASE Bilateral    CESAREAN SECTION  1997   ENDOMETRIAL ABLATION  2003   KNEE SURGERY Left    plate in left knee   LAPAROSCOPIC SUPRACERVICAL HYSTERECTOMY  2012   Bleeding, pain   MYOMECTOMY     hysteroscopy, at time of ablation   NASAL SEPTUM SURGERY     TUBAL LIGATION     postpartum after VBAC   Family History  Problem Relation Age of Onset   Hypertension Mother    Fibromyalgia Mother    Hypertension Father    Multiple sclerosis Brother    Social History   Socioeconomic History   Marital status: Single    Spouse name: Not on file   Number of children: Not on file   Years of education: Not on file   Highest education level: Not on file  Occupational History   Not on file  Tobacco Use   Smoking status: Light Smoker    Pack years: 0.00   Smokeless tobacco: Never  Vaping Use   Vaping Use: Never used  Substance and Sexual Activity   Alcohol use: Not Currently   Drug use: Not Currently   Sexual activity: Not Currently    Partners: Male    Birth control/protection: Condom  Other Topics Concern   Not on file  Social History Narrative   Not on file   Social Determinants of Health   Financial Resource Strain: High Risk   Difficulty of Paying Living Expenses: Hard  Food Insecurity: Food Insecurity Present   Worried About Programme researcher, broadcasting/film/video in the Last Year: Sometimes true   Barista in the Last Year: Sometimes true  Transportation Needs: Unmet Transportation Needs   Lack of Transportation (Medical): Yes   Lack of Transportation (Non-Medical): Yes  Physical Activity: Insufficiently Active   Days of Exercise per Week: 3 days   Minutes of Exercise per Session: 30 min  Stress: Stress Concern Present   Feeling of Stress : Rather much  Social Connections: Moderately Isolated   Frequency of Communication with Friends and Family: More than three times a week   Frequency of Social Gatherings with  Friends and Family: More than three times a week   Attends Religious Services: 1 to 4 times per year   Active Member of [employer] or Organizations: No   Attends Engineer, structural: Never   Marital Status: Never married    Tobacco Counseling Ready to quit: Not Answered Counseling given: Yes   Clinical Intake:  Pre-visit preparation completed: No  Pain : No/denies pain     BMI - recorded: 44.45 Nutritional Status: BMI > 30  Obese Nutritional Risks: None Diabetes: Yes CBG done?: No Did pt. bring in CBG monitor from home?: No  How often do you need to have someone help you when you read instructions, pamphlets, or other written materials from your doctor or pharmacy?: 1 -  Never What is the last grade level you completed in school?: 12th plus some college  Diabetic?yes  Interpreter Needed?: No      Activities of Daily Living In your present state of health, do you have any difficulty performing the following activities: 03/13/2021  Hearing? N  Vision? N  Difficulty concentrating or making decisions? N  Walking or climbing stairs? N  Dressing or bathing? N  Doing errands, shopping? N  Preparing Food and eating ? N  Using the Toilet? N  In the past six months, have you accidently leaked urine? N  Do you have problems with loss of bowel control? N  Managing your Medications? N  Managing your Finances? N  Housekeeping or managing your Housekeeping? N  Some recent data might be hidden    Patient Care Team: Barbette Merino, NP as PCP - General (Adult Health Nurse Practitioner)  Indicate any recent Medical Services you May have received from other than [facility] providers in the past year (date May be approximate).     Assessment:   This is a routine wellness examination for [place].   Dietary issues and exercise activities discussed: Current Exercise Habits: The patient does not participate in regular exercise at present, Exercise limited by: orthopedic  condition(s)   Goals Addressed             This Visit's Progress    DIET - REDUCE FAT INTAKE         Depression Screen PHQ 2/9 Scores 03/13/2021 03/08/2021 11/19/2019  PHQ - 2 Score 1 2 1   PHQ- 9 Score - 4 5    Fall Risk Fall Risk  03/13/2021 03/08/2021 01/01/2020 10/02/2019 08/03/2019  Falls in the past year? 1 0 0 0 0  Number falls in past yr: 1 0 0 0 0  Injury with Fall? 0 0 0 0 0  Risk for fall due to : Impaired mobility - - - -  Follow up Education provided - - - -    FALL RISK PREVENTION PERTAINING TO THE HOME:  Any stairs in or around the home? Yes  If so, are there any without handrails? No  Home free of loose throw rugs in walkways, pet beds, electrical cords, etc? Yes  Adequate lighting in your home to reduce risk of falls? Yes   ASSISTIVE DEVICES UTILIZED TO PREVENT FALLS:  Life alert? No  Use of a cane, walker or w/c? No  Grab bars in the bathroom? No  Shower chair or bench in shower? No  Elevated toilet seat or a handicapped toilet? No   TIMED UP AND GO:  Was the test performed? Yes .  Length of time to ambulate 10 feet: 3 sec.   Gait steady and fast without use of assistive device  Cognitive Function: MMSE - Mini Mental State Exam 03/13/2021  Orientation to time 5  Orientation to Place 5  Registration 3  Attention/ Calculation 5  Recall 3  Language- name 2 objects 2  Language- repeat 1  Language- follow 3 step command 3  Language- read & follow direction 1  Write a sentence 1  Copy design 1  Total score 30        Immunizations Immunization History  Administered Date(s) Administered   Influenza,inj,Quad PF,6+ Mos 08/03/2019    TDAP status: Due, Education has been provided regarding the importance of this vaccine. Advised May receive this vaccine at local pharmacy or Health Dept. Aware to provide a copy of the vaccination record if obtained  from local pharmacy or Health Dept. Verbalized acceptance and understanding.  Flu Vaccine status: Up  to date  Pneumococcal vaccine status: Due, Education has been provided regarding the importance of this vaccine. Advised May receive this vaccine at local pharmacy or Health Dept. Aware to provide a copy of the vaccination record if obtained from local pharmacy or Health Dept. Verbalized acceptance and understanding.  Covid-19 vaccine status: Completed vaccines  Qualifies for Shingles Vaccine? No   Zostavax completed No   Shingrix Completed?: No.    Education has been provided regarding the importance of this vaccine. Patient has been advised to call insurance company to determine out of pocket expense if they have not yet received this vaccine. Advised May also receive vaccine at local pharmacy or Health Dept. Verbalized acceptance and understanding.  Screening Tests Health Maintenance  Topic Date Due   COVID-19 Vaccine (3 - Booster for Moderna series) 03/24/2021 (Originally 07/04/2020)   Pneumococcal Vaccine 77-34 Years old (1 - PCV) 03/08/2022 (Originally 12/24/1982)   TETANUS/TDAP  03/08/2022 (Originally 12/24/1995)   INFLUENZA VACCINE  04/17/2021   PAP SMEAR-Modifier  11/19/2022   Hepatitis C Screening  Completed   HIV Screening  Completed   HPV VACCINES  Aged Out    Health Maintenance  There are no preventive care reminders to display for this patient.  Colorectal cancer screening: due next year  Mammogram status: Completed 2019. Repeat every year  Bone Density: not due  Lung Cancer Screening: (Low Dose CT Chest recommended if Age 90-80 years, 30 pack-year currently smoking OR have quit w/in 15years.) does not qualify.   Lung Cancer Screening Referral: NA  Additional Screening:  Hepatitis C Screening: does qualify; Completed   Vision Screening: Recommended annual ophthalmology exams for early detection of glaucoma and other disorders of the eye. Is the patient up to date with their annual eye exam?  No  Who is the provider or what is the name of the office in which the  patient attends annual eye exams? NA If pt is not established with a provider, would they like to be referred to a provider to establish care?  na .   Dental Screening: Recommended annual dental exams for proper oral hygiene  Community Resource Referral / Chronic Care Management: CRR required this visit?  No   CCM required this visit?  No      Plan:     I have personally reviewed and noted the following in the patient's chart:   Medical and social history Use of alcohol, tobacco or illicit drugs  Current medications and supplements including opioid prescriptions.  Functional ability and status Nutritional status Physical activity Advanced directives List of other physicians Hospitalizations, surgeries, and ER visits in previous 12 months Vitals Screenings to include cognitive, depression, and falls Referrals and appointments  In addition, I have reviewed and discussed with patient certain preventive protocols, quality metrics, and best practice recommendations. A written personalized care plan for preventive services as well as general preventive health recommendations were provided to patient.     Ivonne Andrew, NP   03/13/2021

## 2021-03-13 NOTE — Patient Instructions (Addendum)
Sheri May , Thank you for taking time to come for your Medicare Wellness Visit. I appreciate your ongoing commitment to your health goals. Please review the following plan we discussed and let me know if I can assist you in the future.   These are the goals we discussed:  Goals     . DIET - REDUCE FAT INTAKE    . Quit Smoking        This is a list of the screening recommended for you and due dates:  Health Maintenance  Topic Date Due  . COVID-19 Vaccine (3 - Booster for Moderna series) 03/24/2021*  . Pneumococcal Vaccination (1 - PCV) 03/08/2022*  . Tetanus Vaccine  03/08/2022*  . Flu Shot  04/17/2021  . Pap Smear  11/19/2022  . Hepatitis C Screening: USPSTF Recommendation to screen - Ages 27-79 yo.  Completed  . HIV Screening  Completed  . HPV Vaccine  Aged Out  *Topic was postponed. The date shown is not the original due date.    Fat and Cholesterol Restricted Eating Plan Getting too much fat and cholesterol in your diet may cause health problems. Choosing the right foods helps keep your fat and cholesterol at normal levels.This can keep you from getting certain diseases. Your doctor may recommend an eating plan that includes: Total fat: ______% or less of total calories a day. Saturated fat: ______% or less of total calories a day. Cholesterol: less than _________mg a day. Fiber: ______g a day. What are tips for following this plan? Meal planning At meals, divide your plate into four equal parts: Fill one-half of your plate with vegetables and green salads. Fill one-fourth of your plate with whole grains. Fill one-fourth of your plate with low-fat (lean) protein foods. Eat fish that is high in omega-3 fats at least two times a week. This includes mackerel, tuna, sardines, and salmon. Eat foods that are high in fiber, such as whole grains, beans, apples, broccoli, carrots, peas, and barley. General tips  Work with your doctor to lose weight if you need to. Avoid: Foods  with added sugar. Fried foods. Foods with partially hydrogenated oils. Limit alcohol intake to no more than 1 drink a day for nonpregnant women and 2 drinks a day for men. One drink equals 12 oz of beer, 5 oz of wine, or 1 oz of hard liquor.  Reading food labels Check food labels for: Trans fats. Partially hydrogenated oils. Saturated fat (g) in each serving. Cholesterol (mg) in each serving. Fiber (g) in each serving. Choose foods with healthy fats, such as: Monounsaturated fats. Polyunsaturated fats. Omega-3 fats. Choose grain products that have whole grains. Look for the word "whole" as the first word in the ingredient list. Cooking Cook foods using low-fat methods. These include baking, boiling, grilling, and broiling. Eat more home-cooked foods. Eat at restaurants and buffets less often. Avoid cooking using saturated fats, such as butter, cream, palm oil, palm kernel oil, and coconut oil. Recommended foods  Fruits All fresh, canned (in natural juice), or frozen fruits. Vegetables Fresh or frozen vegetables (raw, steamed, roasted, or grilled). Green salads. Grains Whole grains, such as whole wheat or whole grain breads, crackers, cereals, and pasta. Unsweetened oatmeal, bulgur, barley, quinoa, or brown rice. Corn or whole wheat flour tortillas. Meats and other protein foods Ground beef (85% or leaner), grass-fed beef, or beef trimmed of fat. Skinless chicken or [place]. Ground chicken or [place]. Pork trimmed of fat. All fish and seafood. Egg whites. Dried beans, peas,  or lentils. Unsalted nuts or seeds. Unsalted canned beans. Nut butters without added sugar or oil. Dairy Low-fat or nonfat dairy products, such as skim or 1% milk, 2% or reduced-fat cheeses, low-fat and fat-free ricotta or cottage cheese, or plain low-fat and nonfat yogurt. Fats and oils Tub margarine without trans fats. Light or reduced-fat mayonnaise and salad dressings. Avocado. Olive, canola, sesame, or  safflower oils. The items listed above may not be a complete list of foods and beverages youcan eat. Contact a dietitian for more information. Foods to avoid Fruits Canned fruit in heavy syrup. Fruit in cream or butter sauce. Fried fruit. Vegetables Vegetables cooked in cheese, cream, or butter sauce. Fried vegetables. Grains White bread. White pasta. White rice. Cornbread. Bagels, pastries, and croissants. Crackers and snack foods that contain trans fat and hydrogenated oils. Meats and other protein foods Fatty cuts of meat. Ribs, chicken wings, bacon, sausage, bologna, salami, chitterlings, fatback, hot dogs, bratwurst, and packaged lunch meats. Liver and organ meats. Whole eggs and egg yolks. Chicken and [place] with skin. Fried meat. Dairy Whole or 2% milk, cream, half-and-half, and cream cheese. Whole milk cheeses. Whole-fat or sweetened yogurt. Full-fat cheeses. Nondairy creamers and whipped toppings. Processed cheese, cheese spreads, and cheese curds. Beverages Alcohol. Sugar-sweetened drinks such as sodas, lemonade, and fruit drinks. Fats and oils Butter, stick margarine, lard, shortening, ghee, or bacon fat. Coconut, palm kernel, and palm oils. Sweets and desserts Corn syrup, sugars, honey, and molasses. Candy. Jam and jelly. Syrup. Sweetened cereals. Cookies, pies, cakes, donuts, muffins, and ice cream. The items listed above may not be a complete list of foods and beverages youshould avoid. Contact a dietitian for more information. Summary Choosing the right foods helps keep your fat and cholesterol at normal levels. This can keep you from getting certain diseases. At meals, fill one-half of your plate with vegetables and green salads. Eat high-fiber foods, like whole grains, beans, apples, carrots, peas, and barley. Limit added sugar, saturated fats, alcohol, and fried foods. This information is not intended to replace advice given to you by your health care provider. Make sure  you discuss any questions you have with your healthcare provider. Document Revised: 01/06/2020 Document Reviewed: 01/06/2020 Elsevier Patient Education  2022 [employer].  Fall Prevention in the Home, Adult Falls can cause injuries and can happen to people of all ages. There are many things you can do to make your home safe and to help prevent falls. Ask forhelp when making these changes. What actions can I take to prevent falls? General Instructions Use good lighting in all rooms. Replace any light bulbs that burn out. Turn on the lights in dark areas. Use night-lights. Keep items that you use often in easy-to-reach places. Lower the shelves around your home if needed. Set up your furniture so you have a clear path. Avoid moving your furniture around. Do not have throw rugs or other things on the floor that can make you trip. Avoid walking on wet floors. If any of your floors are uneven, fix them. Add color or contrast paint or tape to clearly mark and help you see: Grab bars or handrails. First and last steps of staircases. Where the edge of each step is. If you use a stepladder: Make sure that it is fully opened. Do not climb a closed stepladder. Make sure the sides of the stepladder are locked in place. Ask someone to hold the stepladder while you use it. Know where your pets are when moving through your  home. What can I do in the bathroom?     Keep the floor dry. Clean up any water on the floor right away. Remove soap buildup in the tub or shower. Use nonskid mats or decals on the floor of the tub or shower. Attach bath mats securely with double-sided, nonslip rug tape. If you need to sit down in the shower, use a plastic, nonslip stool. Install grab bars by the toilet and in the tub and shower. Do not use towel bars as grab bars. What can I do in the bedroom? Make sure that you have a light by your bed that is easy to reach. Do not use any sheets or blankets for your bed  that hang to the floor. Have a firm chair with side arms that you can use for support when you get dressed. What can I do in the kitchen? Clean up any spills right away. If you need to reach something above you, use a step stool with a grab bar. Keep electrical cords out of the way. Do not use floor polish or wax that makes floors slippery. What can I do with my stairs? Do not leave any items on the stairs. Make sure that you have a light switch at the top and the bottom of the stairs. Make sure that there are handrails on both sides of the stairs. Fix handrails that are broken or loose. Install nonslip stair treads on all your stairs. Avoid having throw rugs at the top or bottom of the stairs. Choose a carpet that does not hide the edge of the steps on the stairs. Check carpeting to make sure that it is firmly attached to the stairs. Fix carpet that is loose or worn. What can I do on the outside of my home? Use bright outdoor lighting. Fix the edges of walkways and driveways and fix any cracks. Remove anything that might make you trip as you walk through a door, such as a raised step or threshold. Trim any bushes or trees on paths to your home. Check to see if handrails are loose or broken and that both sides of all steps have handrails. Install guardrails along the edges of any raised decks and porches. Clear paths of anything that can make you trip, such as tools or rocks. Have leaves, snow, or ice cleared regularly. Use sand or salt on paths during winter. Clean up any spills in your garage right away. This includes grease or oil spills. What other actions can I take? Wear shoes that: Have a low heel. Do not wear high heels. Have rubber bottoms. Feel good on your feet and fit well. Are closed at the toe. Do not wear open-toe sandals. Use tools that help you move around if needed. These include: Canes. Walkers. Scooters. Crutches. Review your medicines with your doctor. Some  medicines can make you feel dizzy. This can increase your chance of falling. Ask your doctor what else you can do to help prevent falls. Where to find more information Centers for Disease Control and Prevention, [employer]: FootballExhibition.com.br [employer] on Aging: https://walker.com/ Contact a doctor if: You are afraid of falling at home. You feel weak, drowsy, or dizzy at home. You fall at home. Summary There are many simple things that you can do to make your home safe and to help prevent falls. Ways to make your home safe include removing things that can make you trip and installing grab bars in the bathroom. Ask for help when making these  changes in your home. This information is not intended to replace advice given to you by your health care provider. Make sure you discuss any questions you have with your healthcare provider. Document Revised: 04/06/2020 Document Reviewed: 04/06/2020 Elsevier Patient Education  2022 Elsevier Inc.  Health Maintenance, Female Adopting a healthy lifestyle and getting preventive care are important in promoting health and wellness. Ask your health care provider about: The right schedule for you to have regular tests and exams. Things you can do on your own to prevent diseases and keep yourself healthy. What should I know about diet, weight, and exercise? Eat a healthy diet  Eat a diet that includes plenty of vegetables, fruits, low-fat dairy products, and lean protein. Do not eat a lot of foods that are high in solid fats, added sugars, or sodium.  Maintain a healthy weight Body mass index (BMI) is used to identify weight problems. It estimates body fat based on height and weight. Your health care provider can help determineyour BMI and help you achieve or maintain a healthy weight. Get regular exercise Get regular exercise. This is one of the most important things you can do for your health. Most adults should: Exercise for at least 150 minutes each week.  The exercise should increase your heart rate and make you sweat (moderate-intensity exercise). Do strengthening exercises at least twice a week. This is in addition to the moderate-intensity exercise. Spend less time sitting. Even light physical activity can be beneficial. Watch cholesterol and blood lipids Have your blood tested for lipids and cholesterol at 44 years of age, then havethis test every 5 years. Have your cholesterol levels checked more often if: Your lipid or cholesterol levels are high. You are older than 44 years of age. You are at high risk for heart disease. What should I know about cancer screening? Depending on your health history and family history, you may need to have cancer screening at various ages. This may include screening for: Breast cancer. Cervical cancer. Colorectal cancer. Skin cancer. Lung cancer. What should I know about heart disease, diabetes, and high blood pressure? Blood pressure and heart disease High blood pressure causes heart disease and increases the risk of stroke. This is more likely to develop in people who have high blood pressure readings, are of African descent, or are overweight. Have your blood pressure checked: Every 3-5 years if you are 57-22 years of age. Every year if you are 11 years old or older. Diabetes Have regular diabetes screenings. This checks your fasting blood sugar level. Have the screening done: Once every three years after age 66 if you are at a normal weight and have a low risk for diabetes. More often and at a younger age if you are overweight or have a high risk for diabetes. What should I know about preventing infection? Hepatitis B If you have a higher risk for hepatitis B, you should be screened for this virus. Talk with your health care provider to find out if you are at risk forhepatitis B infection. Hepatitis C Testing is recommended for: Everyone born from [birth year] through 1964. Anyone with known risk  factors for hepatitis C. Sexually transmitted infections (STIs) Get screened for STIs, including gonorrhea and chlamydia, if: You are sexually active and are younger than 44 years of age. You are older than 45 years of age and your health care provider tells you that you are at risk for this type of infection. Your sexual activity has changed since you were  last screened, and you are at increased risk for chlamydia or gonorrhea. Ask your health care provider if you are at risk. Ask your health care provider about whether you are at high risk for HIV. Your health care provider may recommend a prescription medicine to help prevent HIV infection. If you choose to take medicine to prevent HIV, you should first get tested for HIV. You should then be tested every 3 months for as long as you are taking the medicine. Pregnancy If you are about to stop having your period (premenopausal) and you may become pregnant, seek counseling before you get pregnant. Take 400 to 800 micrograms (mcg) of folic acid every day if you become pregnant. Ask for birth control (contraception) if you want to prevent pregnancy. Osteoporosis and menopause Osteoporosis is a disease in which the bones lose minerals and strength with aging. This can result in bone fractures. If you are 44 years old or older, or if you are at risk for osteoporosis and fractures, ask your health care provider if you should: Be screened for bone loss. Take a calcium or vitamin D supplement to lower your risk of fractures. Be given hormone replacement therapy (HRT) to treat symptoms of menopause. Follow these instructions at home: Lifestyle Do not use any products that contain nicotine or tobacco, such as cigarettes, e-cigarettes, and chewing tobacco. If you need help quitting, ask your health care provider. Do not use street drugs. Do not share needles. Ask your health care provider for help if you need support or information about quitting  drugs. Alcohol use Do not drink alcohol if: Your health care provider tells you not to drink. You are pregnant, may be pregnant, or are planning to become pregnant. If you drink alcohol: Limit how much you use to 0-1 drink a day. Limit intake if you are breastfeeding. Be aware of how much alcohol is in your drink. In the [place] one drink equals one 12 oz bottle of beer (355 mL), one 5 oz glass of wine (148 mL), or one 1 oz glass of hard liquor (44 mL). General instructions Schedule regular health, dental, and eye exams. Stay current with your vaccines. Tell your health care provider if: You often feel depressed. You have ever been abused or do not feel safe at home. Summary Adopting a healthy lifestyle and getting preventive care are important in promoting health and wellness. Follow your health care provider's instructions about healthy diet, exercising, and getting tested or screened for diseases. Follow your health care provider's instructions on monitoring your cholesterol and blood pressure. This information is not intended to replace advice given to you by your health care provider. Make sure you discuss any questions you have with your healthcare provider. Document Revised: 08/27/2018 Document Reviewed: 08/27/2018 Elsevier Patient Education  2022 Elsevier Inc.  Mammogram A mammogram is an X-ray of the breasts. This procedure can screen for and detect any changes that may indicate breast cancer. Mammograms are regularly done beginning at age 79 for women with average risk. A man may have amammogram if he has a lump or swelling in his breast tissue. A mammogram can also identify other changes and variations in the breast, such as: Inflammation of the breast tissue (mastitis). An infected area that contains a collection of pus (abscess). A fluid-filled sac (cyst). Tumors that are not cancerous (benign). Fibrocystic changes. This is when breast tissue becomes denser and can make the  tissue feel rope-like or uneven under the skin. Women at higher risk  for breast cancer need earlier and more comprehensive screening for abnormal changes. Breast tomosynthesis, or three-dimensional (3D) mammography, and digital breast tomosynthesis are advanced forms of imagingthat create 3D pictures of the breasts. Tell a health care provider: About any allergies you have. If you have breast implants. If you have had previous breast disease, biopsy, or surgery. If you have a family history of breast cancer. If you are breastfeeding. Whether you are pregnant or may be pregnant. What are the risks? Generally, this is a safe procedure. However, problems may occur, including: Exposure to radiation. Radiation levels are very low with this test. The need for more tests. The mammogram fails to detect certain cancers or the results are misinterpreted. Difficulty with detecting breast cancer in women with dense breasts. What happens before the procedure? Schedule your test about 1-2 weeks after your menstrual period if you are menstruating. This is usually when your breasts are the least tender. If you have had a mammogram done at a different facility in the past, get the mammogram X-rays or have them sent to your current exam facility. The new and old images will be compared. Wash your breasts and underarms on the day of the test. Do not wear deodorants, perfumes, lotions, or powders anywhere on your body on the day of the test. Remove any jewelry from your neck. Wear clothes that you can change into and out of easily. What happens during the procedure?  You will undress from the waist up and put on a gown that opens in the front. You will stand in front of the X-ray machine. Each breast will be placed between two plastic or glass plates. The plates will compress your breast for a few seconds. Try to stay as relaxed as possible. This procedure does not cause any harm to your breasts. Any  discomfort you feel will be very brief. X-rays will be taken from different angles of each breast. The procedure may vary among health care providers and hospitals. What can I expect after the procedure? The mammogram will be examined by a specialist (radiologist). You may need to repeat certain parts of the test, depending on the quality of the images. This is done if the radiologist needs a better view of the breast tissue. You may resume your normal activities. It is up to you to get the results of your procedure. Ask your health care provider, or the department that is doing the procedure, when your results will be ready. Summary A mammogram is an X-ray of the breasts. This procedure can screen for and detect any changes that may indicate breast cancer. A man may have a mammogram if he has a lump or swelling in his breast tissue. If you have had a mammogram done at a different facility in the past, get the mammogram X-rays or have them sent to your current exam facility in order to compare them. Schedule your test about 1-2 weeks after your menstrual period if you are menstruating. Ask when your test results will be ready. Make sure you get your test results. This information is not intended to replace advice given to you by your health care provider. Make sure you discuss any questions you have with your healthcare provider. Document Revised: 07/04/2020 Document Reviewed: 07/04/2020 Elsevier Patient Education  2022 Elsevier Inc.  Steps to Quit Smoking Smoking tobacco is the leading cause of preventable death. It can affect almost every organ in the body. Smoking puts you and those around you at risk for  developing many serious chronic diseases. Quitting smoking can be difficult, but it is one of the best things that you can do for your health. It is nevertoo late to quit. How do I get ready to quit? When you decide to quit smoking, create a plan to help you succeed. Before you quit: Pick  a date to quit. Set a date within the next 2 weeks to give you time to prepare. Write down the reasons why you are quitting. Keep this list in places where you will see it often. Tell your family, friends, and co-workers that you are quitting. Support from your loved ones can make quitting easier. Talk with your health care provider about your options for quitting smoking. Find out what treatment options are covered by your health insurance. Identify people, places, things, and activities that make you want to smoke (triggers). Avoid them. What first steps can I take to quit smoking? Throw away all cigarettes at home, at work, and in your car. Throw away smoking accessories, such as Set designer. Clean your car. Make sure to empty the ashtray. Clean your home, including curtains and carpets. What strategies can I use to quit smoking? Talk with your health care provider about combining strategies, such as taking medicines while you are also receiving in-person counseling. Using these two strategies together makes you more likely to succeed in quitting than if you used either strategy on its own. If you are pregnant or breastfeeding, talk with your health care provider about finding counseling or other support strategies to quit smoking. Do not take medicine to help you quit smoking unless your health care provider tells you to do so. To quit smoking: Quit right away Quit smoking completely, instead of gradually reducing how much you smoke over a period of time. Research shows that stopping smoking right away is more successful than gradually quitting. Attend in-person counseling to help you build problem-solving skills. You are more likely to succeed in quitting if you attend counseling sessions regularly. Even short sessions of 10 minutes can be effective. Take medicine You may take medicines to help you quit smoking. Some medicines require a prescription and some you can purchase  over-the-counter. Medicines may have nicotine in them to replace the nicotine in cigarettes. Medicines may: Help to stop cravings. Help to relieve withdrawal symptoms. Your health care provider may recommend: Nicotine patches, gum, or lozenges. Nicotine inhalers or sprays. Non-nicotine medicine that is taken by mouth. Find resources Find resources and support systems that can help you to quit smoking and remain smoke-free after you quit. These resources are most helpful when you use them often. They include: Online chats with a Veterinary surgeon. Telephone quitlines. Printed Materials engineer. Support groups or group counseling. Text messaging programs. Mobile phone apps or applications. Use apps that can help you stick to your quit plan by providing reminders, tips, and encouragement. There are many free apps for mobile devices as well as websites. Examples include Quit Guide from the [employer] and smokefree.gov What things can I do to make it easier to quit?  Reach out to your family and friends for support and encouragement. Call telephone quitlines (1-800-QUIT-NOW), reach out to support groups, or work with a counselor for support. Ask people who smoke to avoid smoking around you. Avoid places that trigger you to smoke, such as bars, parties, or smoke-break areas at work. Spend time with people who do not smoke. Lessen the stress in your life. Stress can be a smoking trigger for  some people. To lessen stress, try: Exercising regularly. Doing deep-breathing exercises. Doing yoga. Meditating. Performing a body scan. This involves closing your eyes, scanning your body from head to toe, and noticing which parts of your body are particularly tense. Try to relax the muscles in those areas. How will I feel when I quit smoking? Day 1 to 3 weeks Within the first 24 hours of quitting smoking, you may start to feel withdrawal symptoms. These symptoms are usually most noticeable 2-3 days after quitting, but  they usually do not last for more than 2-3 weeks. You may experience these symptoms: Mood swings. Restlessness, anxiety, or irritability. Trouble concentrating. Dizziness. Strong cravings for sugary foods and nicotine. Mild weight gain. Constipation. Nausea. Coughing or a sore throat. Changes in how the medicines that you take for unrelated issues work in your body. Depression. Trouble sleeping (insomnia). Week 3 and afterward After the first 2-3 weeks of quitting, you may start to notice more positive results, such as: Improved sense of smell and taste. Decreased coughing and sore throat. Slower heart rate. Lower blood pressure. Clearer skin. The ability to breathe more easily. Fewer sick days. Quitting smoking can be very challenging. Do not get discouraged if you are not successful the first time. Some people need to make many attempts to quit before they achieve long-term success. Do your best to stick to your quit plan, and talk with your health care provider if you haveany questions or concerns. Summary Smoking tobacco is the leading cause of preventable death. Quitting smoking is one of the best things that you can do for your health. When you decide to quit smoking, create a plan to help you succeed. Quit smoking right away, not slowly over a period of time. When you start quitting, seek help from your health care provider, family, or friends. This information is not intended to replace advice given to you by your health care provider. Make sure you discuss any questions you have with your healthcare provider. Document Revised: 05/29/2019 Document Reviewed: 11/22/2018 Elsevier Patient Education  2022 Elsevier Inc.  Steps to Quit Smoking Smoking tobacco is the leading cause of preventable death. It can affect almost every organ in the body. Smoking puts you and people around you at risk for many serious, long-lasting (chronic) diseases. Quitting smoking can be hard, but it is  one of the best things thatyou can do for your health. It is never too late to quit. How do I get ready to quit? When you decide to quit smoking, make a plan to help you succeed. Before you quit: Pick a date to quit. Set a date within the next 2 weeks to give you time to prepare. Write down the reasons why you are quitting. Keep this list in places where you will see it often. Tell your family, friends, and co-workers that you are quitting. Their support is important. Talk with your doctor about the choices that may help you quit. Find out if your health insurance will pay for these treatments. Know the people, places, things, and activities that make you want to smoke (triggers). Avoid them. What first steps can I take to quit smoking? Throw away all cigarettes at home, at work, and in your car. Throw away the things that you use when you smoke, such as ashtrays and lighters. Clean your car. Make sure to empty the ashtray. Clean your home, including curtains and carpets. What can I do to help me quit smoking? Talk with your doctor about  taking medicines and seeing a counselor at the same time. You are more likely to succeed when you do both. If you are pregnant or breastfeeding, talk with your doctor about counseling or other ways to quit smoking. Do not take medicine to help you quit smoking unless your doctor tells you to do so. To quit smoking: Quit right away Quit smoking totally, instead of slowly cutting back on how much you smoke over a period of time. Go to counseling. You are more likely to quit if you go to counseling sessions regularly. Take medicine You may take medicines to help you quit. Some medicines need a prescription, and some you can buy over-the-counter. Some medicines may contain a drug called nicotine to replace the nicotine in cigarettes. Medicines may: Help you to stop having the desire to smoke (cravings). Help to stop the problems that come when you stop smoking  (withdrawal symptoms). Your doctor may ask you to use: Nicotine patches, gum, or lozenges. Nicotine inhalers or sprays. Non-nicotine medicine that is taken by mouth. Find resources Find resources and other ways to help you quit smoking and remain smoke-free after you quit. These resources are most helpful when you use them often. They include: Online chats with a Veterinary surgeoncounselor. Phone quitlines. Printed Materials engineerself-help materials. Support groups or group counseling. Text messaging programs. Mobile phone apps. Use apps on your mobile phone or tablet that can help you stick to your quit plan. There are many free apps for mobile phones and tablets as well as websites. Examples include Quit Guide from the [employer] and smokefree.gov  What things can I do to make it easier to quit?  Talk to your family and friends. Ask them to support and encourage you. Call a phone quitline (1-800-QUIT-NOW), reach out to support groups, or work with a Veterinary surgeoncounselor. Ask people who smoke to not smoke around you. Avoid places that make you want to smoke, such as: Bars. Parties. Smoke-break areas at work. Spend time with people who do not smoke. Lower the stress in your life. Stress can make you want to smoke. Try these things to help your stress: Getting regular exercise. Doing deep-breathing exercises. Doing yoga. Meditating. Doing a body scan. To do this, close your eyes, focus on one area of your body at a time from head to toe. Notice which parts of your body are tense. Try to relax the muscles in those areas. How will I feel when I quit smoking? Day 1 to 3 weeks Within the first 24 hours, you may start to have some problems that come from quitting tobacco. These problems are very bad 2-3 days after you quit, but they do not often last for more than 2-3 weeks. You may get these symptoms: Mood swings. Feeling restless, nervous, angry, or annoyed. Trouble concentrating. Dizziness. Strong desire for high-sugar foods and  nicotine. Weight gain. Trouble pooping (constipation). Feeling like you may vomit (nausea). Coughing or a sore throat. Changes in how the medicines that you take for other issues work in your body. Depression. Trouble sleeping (insomnia). Week 3 and afterward After the first 2-3 weeks of quitting, you may start to notice more positive results, such as: Better sense of smell and taste. Less coughing and sore throat. Slower heart rate. Lower blood pressure. Clearer skin. Better breathing. Fewer sick days. Quitting smoking can be hard. Do not give up if you fail the first time. Some people need to try a few times before they succeed. Do your best to stick to  your quit plan, and talk with yourdoctor if you have any questions or concerns. Summary Smoking tobacco is the leading cause of preventable death. Quitting smoking can be hard, but it is one of the best things that you can do for your health. When you decide to quit smoking, make a plan to help you succeed. Quit smoking right away, not slowly over a period of time. When you start quitting, seek help from your doctor, family, or friends. This information is not intended to replace advice given to you by your health care provider. Make sure you discuss any questions you have with your healthcare provider. Document Revised: 05/29/2019 Document Reviewed: 11/22/2018 Elsevier Patient Education  2022 [employer].

## 2021-03-24 ENCOUNTER — Ambulatory Visit: Payer: Medicare Other | Attending: Nurse Practitioner | Admitting: Physical Therapy

## 2021-03-24 ENCOUNTER — Other Ambulatory Visit: Payer: Self-pay

## 2021-03-24 DIAGNOSIS — R262 Difficulty in walking, not elsewhere classified: Secondary | ICD-10-CM | POA: Diagnosis present

## 2021-03-24 DIAGNOSIS — G8929 Other chronic pain: Secondary | ICD-10-CM | POA: Insufficient documentation

## 2021-03-24 DIAGNOSIS — M5441 Lumbago with sciatica, right side: Secondary | ICD-10-CM | POA: Diagnosis present

## 2021-03-24 DIAGNOSIS — R2689 Other abnormalities of gait and mobility: Secondary | ICD-10-CM

## 2021-03-24 DIAGNOSIS — M6281 Muscle weakness (generalized): Secondary | ICD-10-CM | POA: Diagnosis present

## 2021-03-24 NOTE — Therapy (Signed)
[facility] [address] [place], [place], [address] Phone: [phone]   Fax:  [phone]  Physical Therapy Evaluation  Patient Details  Name: Sheri May MRN: [id number] Date of Birth: 1976-05-15 Referring Provider (PT): Barbette Merino, NP   Encounter Date: 03/24/2021   PT End of Session - 03/24/21 0946     Visit Number 1    Number of Visits 12    Date for PT Re-Evaluation 05/05/21    Authorization Type Medicare    Progress Note Due on Visit 10    PT Start Time 0935    PT Stop Time 1015    PT Time Calculation (min) 40 min    Activity Tolerance Patient tolerated treatment well    Behavior During Therapy [place] for tasks assessed/performed             Past Medical History:  Diagnosis Date   Anxiety 09/2019   Arthritis    Asthma    Bone spur of left foot    Fibromyalgia    GERD without esophagitis 07/2019   H/O scleroderma    History of uterine fibroid    removal 2013   Homelessness 09/2019   Hyperlipidemia 07/2019   Systemic sclerosis (HCC)    Vitamin D deficiency 07/2019    Past Surgical History:  Procedure Laterality Date   CARPAL TUNNEL RELEASE Bilateral    CESAREAN SECTION  1997   ENDOMETRIAL ABLATION  2003   KNEE SURGERY Left    plate in left knee   LAPAROSCOPIC SUPRACERVICAL HYSTERECTOMY  2012   Bleeding, pain   MYOMECTOMY     hysteroscopy, at time of ablation   NASAL SEPTUM SURGERY     TUBAL LIGATION     postpartum after VBAC    There were no vitals filed for this visit.    Subjective Assessment - 03/24/21 0940     Subjective Pt reports back and sciatic nerve issues. Pt originally thought she had a kidney stone. Pt did have PT in the past for this issue when she was pregnant 22 years ago. Started with back pain which really worsened the beginning this year. Trying to get up and move around she was in tears. Pt did get a shot for her back pain in 2009/2010 which did help -- but she has since  relocated and has not received any injection this year.    Pertinent History Anxiety (09/2019), Arthritis, Asthma, Bone spurs throughout spine, Fibromyalgia, GERD without esophagitis (07/2019), H/O scleroderma, History of uterine fibroid, Homelessness (09/2019), Hyperlipidemia (07/2019), Systemic sclerosis (HCC), and Vitamin D deficiency (07/2019), L knee surgery x 2    Limitations Walking;Standing;Lifting    How long can you sit comfortably? Sitting too long can cause numbness (i.e. sometimes while using restroom she can feel it radiating down her R leg)    How long can you stand comfortably? Variable -- can also feel numbness while standing    How long can you walk comfortably? Variable -- reports walking 4 miles last year without issues, now can't get a mile in    Diagnostic tests X-ray: Multilevel degenerative changes, as described above.    Patient Stated Goals Get back to walking, feel better    Currently in Pain? Yes    Pain Score 6    at worst can be 10/10 when she's laying down   Pain Location Back    Pain Orientation Mid;Lower    Pain Descriptors / Indicators Stabbing;Burning   "fire"  Pain Type Chronic pain    Pain Radiating Towards Right low back into back/side of right leg    Pain Onset More than a month ago    Pain Frequency Constant    Aggravating Factors  Increased activity    Pain Relieving Factors Ice, heat, stretches    Effect of Pain on Daily Activities Decreased standing and walking                [facility] - 03/24/21 0001       Assessment   Medical Diagnosis M54.41,G89.29 (ICD-10-CM) - Chronic right-sided low back pain with right-sided sciatica    Referring Provider (PT) Barbette MerinoKing, Crystal M, NP    Onset Date/Surgical Date --   At least 20 years   Prior Therapy PT years ago for her back during pregnancy      Precautions   Precautions None      Restrictions   Weight Bearing Restrictions No      Balance Screen   Has the patient fallen in the past  6 months Yes    How many times? 2   while walking and while getting dressed   Has the patient had a decrease in activity level because of a fear of falling?  Yes    Is the patient reluctant to leave their home because of a fear of falling?  Yes      Home Environment   Living Environment Private residence    Living Arrangements Children   Living with daughter   Available Help at Discharge Family    Type of Home Apartment    Home Access Stairs to enter    Entrance Stairs-Number of Steps 2 flights of stairs    Home Layout Two level    Alternate Level Stairs-Number of Steps 15    Home Equipment None      Prior Function   Level of Independence Independent    Leisure Walking      Observation/Other Assessments   Focus on Therapeutic Outcomes (FOTO)  31; predicted 48      ROM / Strength   AROM / PROM / Strength Strength;AROM      AROM   AROM Assessment Site Lumbar    Lumbar Flexion 70    Lumbar Extension 40    Lumbar - Right Side Bend to patella    Lumbar - Left Side Bend 1" above patella   felt pull on right   Lumbar - Right Rotation WFL    Lumbar - Left Rotation WFL   A little more tightness     Strength   Overall Strength Comments R hip extension 3+/5, hip abduction 4/5; L hip extension 4+/5, hip abduction 5/5. Otherwise LE grossly at least 4+/5      Palpation   Spinal mobility Spring testing appear [facility]    Palpation comment Mild tightness noted along R QL; report of tenderness on R ischial tubersity      Special Tests    Special Tests Lumbar    Lumbar Tests FABER test;Slump Test;Prone Knee Bend Test;Straight Leg Raise      FABER test   findings Positive    Side Right    Comment Feels in groin      Slump test   Findings Positive    Side Right      Prone Knee Bend Test   Findings Positive    Side Right    Comment Feels it running from front of thigh to back  Straight Leg Raise   Findings Negative    Comment Felt in R calf at ~60 deg      Transfers   Five  time sit to stand comments  15 sec                        Objective measurements completed on examination: See above findings.               PT Education - 03/24/21 1115     Education Details Exam findings, POC    Person(s) Educated Patient    Methods Explanation;Demonstration    Comprehension Verbalized understanding;Returned demonstration;Verbal cues required;Tactile cues required              PT Short Term Goals - 03/24/21 1128       PT SHORT TERM GOAL #1   Title STG = LTGs               PT Long Term Goals - 03/24/21 1128       PT LONG TERM GOAL #1   Title Pt will be independent with HEP    Time 6    Period Weeks    Status New    Target Date 05/05/21      PT LONG TERM GOAL #2   Title Pt will be able to demo improved 5x STS <13 sec for decreased fall risk    Time 6    Period Weeks    Status New    Target Date 05/05/21      PT LONG TERM GOAL #3   Title Pt will be have L=R SLR with no tension    Baseline ~60 deg on R before increased tension/burning    Time 6    Period Weeks    Status New    Target Date 05/05/21      PT LONG TERM GOAL #4   Title Pt will have improved FOTO score to 48    Baseline 31    Time 6    Period Weeks    Status New    Target Date 05/05/21                    Plan - 03/24/21 1115     Clinical Impression Statement Sheri May is a 45 y/o F presenting to OPPT due to complaint of low back pain and R sciatica. On assessment, pt demos R>L hip weakness, core weakness, and increased R side nerve tension affecting her tolerance with home and community activities. Pt with history of falls and 5x STS score places her at high risk. Pt would benefit from PT to address her pain for return to leisure activities (i.e. walking) and optimize her level of function.    Personal Factors and Comorbidities Age;Fitness;Comorbidity 1;Time since onset of injury/illness/exacerbation;Social Background     Comorbidities Anxiety (09/2019), Arthritis, Asthma, Bone spur of left foot, Fibromyalgia, GERD without esophagitis (07/2019), H/O scleroderma, History of uterine fibroid, Homelessness (09/2019), Hyperlipidemia (07/2019), Systemic sclerosis (HCC), and Vitamin D deficiency (07/2019)    Examination-Activity Limitations Sit;Squat;Stand;Lift;Toileting;Locomotion Level    Examination-Participation Restrictions Community Activity;Shop;Meal Prep    Stability/Clinical Decision Making Evolving/Moderate complexity    Clinical Decision Making Moderate    Rehab Potential Good    PT Frequency 2x / week    PT Duration 6 weeks    PT Treatment/Interventions ADLs/Self Care Home Management;Aquatic Therapy;Electrical Stimulation;Cryotherapy;Iontophoresis 4mg /ml Dexamethasone;Moist Heat;DME Instruction;Gait training;Stair training;Functional mobility training;Therapeutic activities;Therapeutic exercise;Balance training;Neuromuscular  re-education;Manual techniques;Patient/family education;Dry needling;Taping;Spinal Manipulations    PT Next Visit Plan QL/low back/hip flexion stretching, sciatic and femoral nerve glides, initiate hip extension, hip abductor and core strengthening    PT Home Exercise Plan Did not have time on initial eval    Consulted and Agree with Plan of Care Patient             Patient will benefit from skilled therapeutic intervention in order to improve the following deficits and impairments:  Difficulty walking, Increased fascial restricitons, Decreased endurance, Increased muscle spasms, Pain, Decreased balance, Improper body mechanics, Decreased mobility, Decreased strength, Decreased activity tolerance  Visit Diagnosis: Muscle weakness (generalized)  Chronic right-sided low back pain with right-sided sciatica  Difficulty in walking, not elsewhere classified  Other abnormalities of gait and mobility     Problem List Patient Active Problem List   Diagnosis Date Noted   Bone spur  of left foot 01/01/2020   Vaginal bleeding 11/19/2019   Adnexal cyst 11/19/2019   Anxiety 10/04/2019   Homeless single person 08/04/2019   Proteinuria 08/04/2019   Gastroesophageal reflux disease with esophagitis without hemorrhage 08/04/2019   Shortness of breath 08/04/2019   Vitamin D deficiency 08/04/2019   Vitamin B12 deficiency 08/04/2019   Class 2 severe obesity due to excess calories with serious comorbidity and body mass index (BMI) of 37.0 to 37.9 in adult [facility]) 08/04/2019   Generalized abdominal cramps 08/04/2019    Tacha Manni April Dell Ponto PT, DPT 03/24/2021, 11:37 AM  [facility] [address] [place], [place], [address] Phone: [phone]   Fax:  [phone]  Name: Sheri May MRN: [id number] Date of Birth: [birth year]

## 2021-03-28 ENCOUNTER — Other Ambulatory Visit: Payer: Self-pay

## 2021-03-28 ENCOUNTER — Encounter: Payer: Self-pay | Admitting: Physical Therapy

## 2021-03-28 ENCOUNTER — Ambulatory Visit: Payer: Medicare Other | Admitting: Physical Therapy

## 2021-03-28 DIAGNOSIS — M5441 Lumbago with sciatica, right side: Secondary | ICD-10-CM

## 2021-03-28 DIAGNOSIS — R262 Difficulty in walking, not elsewhere classified: Secondary | ICD-10-CM

## 2021-03-28 DIAGNOSIS — M6281 Muscle weakness (generalized): Secondary | ICD-10-CM | POA: Diagnosis not present

## 2021-03-28 DIAGNOSIS — G8929 Other chronic pain: Secondary | ICD-10-CM

## 2021-03-28 DIAGNOSIS — R2689 Other abnormalities of gait and mobility: Secondary | ICD-10-CM

## 2021-03-28 NOTE — Therapy (Signed)
[facility] Outpatient Rehabilitation [facility] [address] [place], [place], [address] Phone: [phone]   Fax:  [phone]  Physical Therapy Treatment  Patient Details  Name: Sheri May MRN: [id number] Date of Birth: 1976-08-18 Referring Provider (PT): Sheri Merino, NP   Encounter Date: 03/28/2021   PT End of Session - 03/28/21 1032     Visit Number 2    Number of Visits 12    Date for PT Re-Evaluation 05/05/21    Authorization Type Medicare    PT Start Time 1020    PT Stop Time 1105    PT Time Calculation (min) 45 min    Activity Tolerance Patient tolerated treatment well    Behavior During Therapy [facility] for tasks assessed/performed             Past Medical History:  Diagnosis Date   Anxiety 09/2019   Arthritis    Asthma    Bone spur of left foot    Fibromyalgia    GERD without esophagitis 07/2019   H/O scleroderma    History of uterine fibroid    removal 2013   Homelessness 09/2019   Hyperlipidemia 07/2019   Systemic sclerosis (HCC)    Vitamin D deficiency 07/2019    Past Surgical History:  Procedure Laterality Date   CARPAL TUNNEL RELEASE Bilateral    CESAREAN SECTION  1997   ENDOMETRIAL ABLATION  2003   KNEE SURGERY Left    plate in left knee   LAPAROSCOPIC SUPRACERVICAL HYSTERECTOMY  2012   Bleeding, pain   MYOMECTOMY     hysteroscopy, at time of ablation   NASAL SEPTUM SURGERY     TUBAL LIGATION     postpartum after VBAC    There were no vitals filed for this visit.   Subjective Assessment - 03/28/21 1029     Subjective Pain is 7/10-8/10 today and it is typical for her lately.  She has sinusitis right now.    Currently in Pain? Yes    Pain Score 7     Pain Location Back    Pain Orientation Lower    Pain Descriptors / Indicators Burning;Shooting;Stabbing    Pain Type Chronic pain    Pain Radiating Towards Rt thigh post , buttock    Pain Onset More than a month ago    Pain Frequency Constant    Aggravating  Factors  activity    Pain Relieving Factors ice, heat                 [facility] Adult PT Treatment/Exercise - 03/28/21 0001       Self-Care   Self-Care Other Self-Care Comments    Other Self-Care Comments  see flowsheet      Lumbar Exercises: Stretches   Active Hamstring Stretch 3 reps;30 seconds      Lumbar Exercises: Seated   Other Seated Lumbar Exercises core press x 10 with exhale    Other Seated Lumbar Exercises sciatic nerve glides Rt LE x 8      Lumbar Exercises: Supine   Ab Set 10 reps    Clam 10 reps    Clam Limitations small ROM    Bridge 10 reps    Bridge Limitations small tilt prior      Lumbar Exercises: Quadruped   Mad[place]/Old Horse 5 reps    Madcat/Old Horse Limitations seated                    PT Education - 03/28/21 1707  Education Details nerve tension, HEP, core    Person(s) Educated Patient    Methods Explanation;Handout    Comprehension Verbalized understanding;Returned demonstration              PT Short Term Goals - 03/24/21 1128       PT SHORT TERM GOAL #1   Title STG = LTGs               PT Long Term Goals - 03/24/21 1128       PT LONG TERM GOAL #1   Title Pt will be independent with HEP    Time 6    Period Weeks    Status New    Target Date 05/05/21      PT LONG TERM GOAL #2   Title Pt will be able to demo improved 5x STS <13 sec for decreased fall risk    Time 6    Period Weeks    Status New    Target Date 05/05/21      PT LONG TERM GOAL #3   Title Pt will be have L=R SLR with no tension    Baseline ~60 deg on R before increased tension/burning    Time 6    Period Weeks    Status New    Target Date 05/05/21      PT LONG TERM GOAL #4   Title Pt will have improved FOTO score to 48    Baseline 31    Time 6    Period Weeks    Status New    Target Date 05/05/21                   Plan - 03/28/21 1037     Clinical Impression Statement Patient did well today, despite high levels  of pain upon start of session. We were able to establish HEP for decompression and nerve stretching, although slump did not reveal much nerve tension today.  Long axis distraction reduce pain in Rt LE and back.    PT Treatment/Interventions ADLs/Self Care Home Management;Aquatic Therapy;Electrical Stimulation;Cryotherapy;Iontophoresis 4mg /ml Dexamethasone;Moist Heat;DME Instruction;Gait training;Stair training;Functional mobility training;Therapeutic activities;Therapeutic exercise;Balance training;Neuromuscular re-education;Manual techniques;Patient/family education;Dry needling;Taping;Spinal Manipulations    PT Next Visit Plan QL/low back/hip flexion stretching, sciatic and femoral nerve glides, initiate hip extension, hip abductor and core strengthening    PT Home Exercise Plan [email or web address]    Consulted and Agree with Plan of Care Patient             Patient will benefit from skilled therapeutic intervention in order to improve the following deficits and impairments:  Difficulty walking, Increased fascial restricitons, Decreased endurance, Increased muscle spasms, Pain, Decreased balance, Improper body mechanics, Decreased mobility, Decreased strength, Decreased activity tolerance  Visit Diagnosis: Muscle weakness (generalized)  Chronic right-sided low back pain with right-sided sciatica  Difficulty in walking, not elsewhere classified  Other abnormalities of gait and mobility     Problem List Patient Active Problem List   Diagnosis Date Noted   Bone spur of left foot 01/01/2020   Vaginal bleeding 11/19/2019   Adnexal cyst 11/19/2019   Anxiety 10/04/2019   Homeless single person 08/04/2019   Proteinuria 08/04/2019   Gastroesophageal reflux disease with esophagitis without hemorrhage 08/04/2019   Shortness of breath 08/04/2019   Vitamin D deficiency 08/04/2019   Vitamin B12 deficiency 08/04/2019   Class 2 severe obesity due to excess calories with serious comorbidity and body  mass index (BMI) of 37.0 to 37.9 in adult [facility]) 08/04/2019  Generalized abdominal cramps 08/04/2019    Sheri May 03/28/2021, 5:27 PM  [facility] [address] [place], [place], [address] Phone: [phone]   Fax:  [phone]  Name: Sheri May MRN: [id number] Date of Birth: 10-14-1975   Karie Mainland, PT 03/28/21 5:27 PM Phone: [phone] Fax: [phone]

## 2021-03-28 NOTE — Patient Instructions (Signed)
Access Code: [email or web address] URL: [email or web address] Date: 03/28/2021 Prepared by: Karie Mainland  Exercises Supine Lower Trunk Rotation - 1-2 x daily - 7 x weekly - 2 sets - 10 reps - 10 hold Supine Hamstring Stretch with Strap - 1-2 x daily - 7 x weekly - 1 sets - 5 reps - 30 hold Seated Sciatic Tensioner - 1-2 x daily - 7 x weekly - 2 sets - 10 reps - 5 hold Supine Transversus Abdominis Bracing - Hands on Stomach - 1-2 x daily - 7 x weekly - 2 sets - 10 reps - 5 hold Supine Transversus Abdominis Bracing with Double Leg Fallout - 1-2 x daily - 7 x weekly - 2 sets - 10 reps Beginner Bridge - 1-2 x daily - 7 x weekly - 2 sets - 10 reps - 5 hold

## 2021-03-30 ENCOUNTER — Encounter: Payer: Self-pay | Admitting: Physical Therapy

## 2021-03-30 ENCOUNTER — Ambulatory Visit: Payer: Medicare Other | Admitting: Physical Therapy

## 2021-03-30 ENCOUNTER — Other Ambulatory Visit: Payer: Self-pay

## 2021-03-30 DIAGNOSIS — G8929 Other chronic pain: Secondary | ICD-10-CM

## 2021-03-30 DIAGNOSIS — R2689 Other abnormalities of gait and mobility: Secondary | ICD-10-CM

## 2021-03-30 DIAGNOSIS — M6281 Muscle weakness (generalized): Secondary | ICD-10-CM

## 2021-03-30 DIAGNOSIS — M5441 Lumbago with sciatica, right side: Secondary | ICD-10-CM

## 2021-03-30 DIAGNOSIS — R262 Difficulty in walking, not elsewhere classified: Secondary | ICD-10-CM

## 2021-03-30 NOTE — Therapy (Signed)
[facility] Outpatient Rehabilitation [facility] [address] [place], [place], [address] Phone: [phone]   Fax:  [phone]  Physical Therapy Treatment  Patient Details  Name: Sheri May MRN: [id number] Date of Birth: 1976-01-29 Referring Provider (PT): Barbette Merino, NP   Encounter Date: 03/30/2021   PT End of Session - 03/30/21 1021     Visit Number 3    Number of Visits 12    Date for PT Re-Evaluation 05/05/21    Authorization Type Medicare    PT Start Time 1018    PT Stop Time 1100    PT Time Calculation (min) 42 min    Activity Tolerance Patient tolerated treatment well    Behavior During Therapy [facility] for tasks assessed/performed             Past Medical History:  Diagnosis Date   Anxiety 09/2019   Arthritis    Asthma    Bone spur of left foot    Fibromyalgia    GERD without esophagitis 07/2019   H/O scleroderma    History of uterine fibroid    removal 2013   Homelessness 09/2019   Hyperlipidemia 07/2019   Systemic sclerosis (HCC)    Vitamin D deficiency 07/2019    Past Surgical History:  Procedure Laterality Date   CARPAL TUNNEL RELEASE Bilateral    CESAREAN SECTION  1997   ENDOMETRIAL ABLATION  2003   KNEE SURGERY Left    plate in left knee   LAPAROSCOPIC SUPRACERVICAL HYSTERECTOMY  2012   Bleeding, pain   MYOMECTOMY     hysteroscopy, at time of ablation   NASAL SEPTUM SURGERY     TUBAL LIGATION     postpartum after VBAC    There were no vitals filed for this visit.   Subjective Assessment - 03/30/21 1020     Subjective Pt is frustrated with a living situation, had an asthma attack.  Back is doing OK.               [facility] Adult PT Treatment/Exercise - 03/30/21 0001       Lumbar Exercises: Stretches   Active Hamstring Stretch 3 reps;30 seconds    Figure 4 Stretch 3 reps;20 seconds    Other Lumbar Stretch Exercise quadratus lumborum Rt side with PT overpressure    Other Lumbar Stretch Exercise standing  forward fold x 3      Lumbar Exercises: Aerobic   Recumbent Bike 5 min L2      Lumbar Exercises: Standing   Other Standing Lumbar Exercises hip abduction x 10    Other Standing Lumbar Exercises hip extension x 10 high mat table      Lumbar Exercises: Supine   Ab Set 10 reps    Clam 10 reps    Clam Limitations small ROM    Bridge with [employer] 10 reps    Bridge with Ball Squeeze Limitations partial      Manual Therapy   Manual Therapy Myofascial release;Other (comment)    Manual therapy comments caudal pressure to iliac crest    Myofascial Release Rt trunk in sidelying    Other Manual Therapy LAD Rt LE x 5                      PT Short Term Goals - 03/24/21 1128       PT SHORT TERM GOAL #1   Title STG = LTGs  PT Long Term Goals - 03/24/21 1128       PT LONG TERM GOAL #1   Title Pt will be independent with HEP    Time 6    Period Weeks    Status New    Target Date 05/05/21      PT LONG TERM GOAL #2   Title Pt will be able to demo improved 5x STS <13 sec for decreased fall risk    Time 6    Period Weeks    Status New    Target Date 05/05/21      PT LONG TERM GOAL #3   Title Pt will be have L=R SLR with no tension    Baseline ~60 deg on R before increased tension/burning    Time 6    Period Weeks    Status New    Target Date 05/05/21      PT LONG TERM GOAL #4   Title Pt will have improved FOTO score to 48    Baseline 31    Time 6    Period Weeks    Status New    Target Date 05/05/21                   Plan - 03/30/21 1026     Clinical Impression Statement Patient did not have pain today during session, just tightness in Rt LE, hip. Cont to understand her HEP, needs min cues thus far. Having issues related to asthma, told her that if she needed to cancel she need to just call ahead of time and she will not be penalized.    PT Treatment/Interventions ADLs/Self Care Home Management;Aquatic Therapy;Electrical  Stimulation;Cryotherapy;Iontophoresis 4mg /ml Dexamethasone;Moist Heat;DME Instruction;Gait training;Stair training;Functional mobility training;Therapeutic activities;Therapeutic exercise;Balance training;Neuromuscular re-education;Manual techniques;Patient/family education;Dry needling;Taping;Spinal Manipulations    PT Next Visit Plan QL/low back/hip flexion stretching, sciatic and femoral nerve glides, initiate hip extension, hip abductor and core strengthening    PT Home Exercise Plan [email or web address]    Consulted and Agree with Plan of Care Patient             Patient will benefit from skilled therapeutic intervention in order to improve the following deficits and impairments:  Difficulty walking, Increased fascial restricitons, Decreased endurance, Increased muscle spasms, Pain, Decreased balance, Improper body mechanics, Decreased mobility, Decreased strength, Decreased activity tolerance  Visit Diagnosis: Muscle weakness (generalized)  Difficulty in walking, not elsewhere classified  Chronic right-sided low back pain with right-sided sciatica  Other abnormalities of gait and mobility     Problem List Patient Active Problem List   Diagnosis Date Noted   Bone spur of left foot 01/01/2020   Vaginal bleeding 11/19/2019   Adnexal cyst 11/19/2019   Anxiety 10/04/2019   Homeless single person 08/04/2019   Proteinuria 08/04/2019   Gastroesophageal reflux disease with esophagitis without hemorrhage 08/04/2019   Shortness of breath 08/04/2019   Vitamin D deficiency 08/04/2019   Vitamin B12 deficiency 08/04/2019   Class 2 severe obesity due to excess calories with serious comorbidity and body mass index (BMI) of 37.0 to 37.9 in adult [facility]) 08/04/2019   Generalized abdominal cramps 08/04/2019    Nainoa Woldt 03/30/2021, 12:30 PM  [facility] Outpatient Rehabilitation [facility] [address] [place], [place], [place] Phone: [phone]   Fax:  [phone]  Name:  Sheri May MRN: Jefm Bryant Date of Birth: 11/10/1975   02/22/1976, PT 03/30/21 12:30 PM Phone: [phone] Fax: [phone]

## 2021-04-05 ENCOUNTER — Ambulatory Visit: Payer: Medicare Other | Admitting: Physical Therapy

## 2021-04-07 ENCOUNTER — Other Ambulatory Visit: Payer: Self-pay

## 2021-04-07 ENCOUNTER — Encounter: Payer: Self-pay | Admitting: Physical Therapy

## 2021-04-07 ENCOUNTER — Ambulatory Visit: Payer: Medicare Other | Admitting: Physical Therapy

## 2021-04-07 DIAGNOSIS — R262 Difficulty in walking, not elsewhere classified: Secondary | ICD-10-CM

## 2021-04-07 DIAGNOSIS — G8929 Other chronic pain: Secondary | ICD-10-CM

## 2021-04-07 DIAGNOSIS — M6281 Muscle weakness (generalized): Secondary | ICD-10-CM

## 2021-04-07 DIAGNOSIS — R2689 Other abnormalities of gait and mobility: Secondary | ICD-10-CM

## 2021-04-07 NOTE — Therapy (Addendum)
[place], [place], [address] Phone: [phone]   Fax:  [phone]  Physical Therapy Treatment/Discharge  Patient Details  Name: Sheri May MRN: [id number] Date of Birth: 1976/08/09 Referring Provider (PT): Vevelyn Francois, NP   Encounter Date: 04/07/2021   PT End of Session - 04/07/21 1106     Visit Number 4    Number of Visits 12    Date for PT Re-Evaluation 05/05/21    Authorization Type Medicare    PT Start Time 1103    PT Stop Time 1141    PT Time Calculation (min) 38 min    Activity Tolerance Patient tolerated treatment well    Behavior During Therapy [facility] for tasks assessed/performed             Past Medical History:  Diagnosis Date   Anxiety 09/2019   Arthritis    Asthma    Bone spur of left foot    Fibromyalgia    GERD without esophagitis 07/2019   H/O scleroderma    History of uterine fibroid    removal 2013   Homelessness 09/2019   Hyperlipidemia 07/2019   Systemic sclerosis ([place])    Vitamin D deficiency 07/2019    Past Surgical History:  Procedure Laterality Date   CARPAL TUNNEL RELEASE Bilateral    CESAREAN SECTION  1997   ENDOMETRIAL ABLATION  2003   KNEE SURGERY Left    plate in left knee   LAPAROSCOPIC SUPRACERVICAL HYSTERECTOMY  2012   Bleeding, pain   MYOMECTOMY     hysteroscopy, at time of ablation   [place]     postpartum after VBAC    There were no vitals filed for this visit.   Subjective Assessment - 04/07/21 1105     Subjective I do my exercises and there is no pain .  Its more in my upper back sometimes. Uses ice or heat sometimes    Currently in Pain? No/denies                 [facility] Adult PT Treatment/Exercise - 04/07/21 0001       Lumbar Exercises: Stretches   Active Hamstring Stretch 2 reps;30 seconds    Lower Trunk Rotation 5 reps;10 seconds      Lumbar Exercises: Aerobic   Nustep 6 min L6 UE  and LE      Lumbar Exercises: Standing   Lifting Limitations hip hinge with blue band x 15    Other Standing Lumbar Exercises latera band walking 2 x 20 feet      Lumbar Exercises: Seated   Sit to Stand 10 reps    Sit to Stand Limitations blue band      Lumbar Exercises: Supine   Ab Set 10 reps    Clam 10 reps    Clam Limitations blue band    Bridge 15 reps    Bridge Limitations blue band                      PT Short Term Goals - 04/07/21 1123       PT SHORT TERM GOAL #1   Title STG = LTGs               PT Long Term Goals - 04/07/21 1107       PT LONG TERM GOAL #1   Title Pt will be independent with HEP  Status On-going      PT LONG TERM GOAL #2   Title Pt will be able to demo improved 5x STS <13 sec for decreased fall risk    Status Achieved      PT LONG TERM GOAL #3   Title Pt will be have L=R SLR with no tension    Status Achieved      PT LONG TERM GOAL #4   Title Pt will have improved FOTO score to 48    Status On-going                   Plan - 04/07/21 1125     Clinical Impression Statement Patient continues without pain today. She showed good technique with hinging and lifting, HEP.  She would like to finish POC to reinforce concepts and maintain her improvements.    Comorbidities Anxiety (09/2019), Arthritis, Asthma, Bone spur of left foot, Fibromyalgia, GERD without esophagitis (07/2019), H/O scleroderma, History of uterine fibroid, Homelessness (09/2019), Hyperlipidemia (07/2019), Systemic sclerosis ([place]), and Vitamin D deficiency (07/2019)    PT Treatment/Interventions ADLs/Self Care Home Management;Aquatic Therapy;Electrical Stimulation;Cryotherapy;Iontophoresis 8m/ml Dexamethasone;Moist Heat;DME Instruction;Gait training;Stair training;Functional mobility training;Therapeutic activities;Therapeutic exercise;Balance training;Neuromuscular re-education;Manual techniques;Patient/family education;Dry needling;Taping;Spinal  Manipulations    PT Next Visit Plan QL/low back/hip flexion stretching, sciatic and femoral nerve glides, initiate hip extension, hip abductor and core strengthening    PT Home Exercise Plan [email or web address]    Consulted and Agree with Plan of Care Patient             Patient will benefit from skilled therapeutic intervention in order to improve the following deficits and impairments:  Difficulty walking, Increased fascial restricitons, Decreased endurance, Increased muscle spasms, Pain, Decreased balance, Improper body mechanics, Decreased mobility, Decreased strength, Decreased activity tolerance  Visit Diagnosis: Muscle weakness (generalized)  Chronic right-sided low back pain with right-sided sciatica  Difficulty in walking, not elsewhere classified  Other abnormalities of gait and mobility     Problem List Patient Active Problem List   Diagnosis Date Noted   Bone spur of left foot 01/01/2020   Vaginal bleeding 11/19/2019   Adnexal cyst 11/19/2019   Anxiety 10/04/2019   Homeless single person 08/04/2019   Proteinuria 08/04/2019   Gastroesophageal reflux disease with esophagitis without hemorrhage 08/04/2019   Shortness of breath 08/04/2019   Vitamin D deficiency 08/04/2019   Vitamin B12 deficiency 08/04/2019   Class 2 severe obesity due to excess calories with serious comorbidity and body mass index (BMI) of 37.0 to 37.9 in adult ([facility] 08/04/2019   Generalized abdominal cramps 08/04/2019    Ellan May 04/07/2021, 11:48 AM  [place] [address]: [phone]  Fax:  [phone] Name: Sheri FigeroaMRN: [id number] of Birth: 407/18/1977  JRaeford Razor PT 04/07/21 11:48 AM Phone: [phone]: [phone]  PHYSICAL THERAPY DISCHARGE SUMMARY  Visits from Start of Care: 4  Current functional level related to goals / functional outcomes: See above      Remaining  deficits: Unknown, has not attended    Education / Equipment: HEP    Patient agrees to discharge. Patient goals were not met. Patient is being discharged due to not returning since the last visit.  JRaeford Razor PT 05/26/21 11:31 AM Phone: [phone]: [phone]

## 2021-04-19 ENCOUNTER — Encounter: Payer: Medicare Other | Admitting: Physical Therapy

## 2021-04-21 ENCOUNTER — Encounter: Payer: Medicare Other | Admitting: Physical Therapy

## 2021-05-27 IMAGING — DX DG CHEST 2V
2 series · 2 of 2 positions shown · non-contrast
Comparison: None.

CLINICAL DATA: Chest pain for 1 week

EXAM:
CHEST - 2 VIEW

[chest pa]
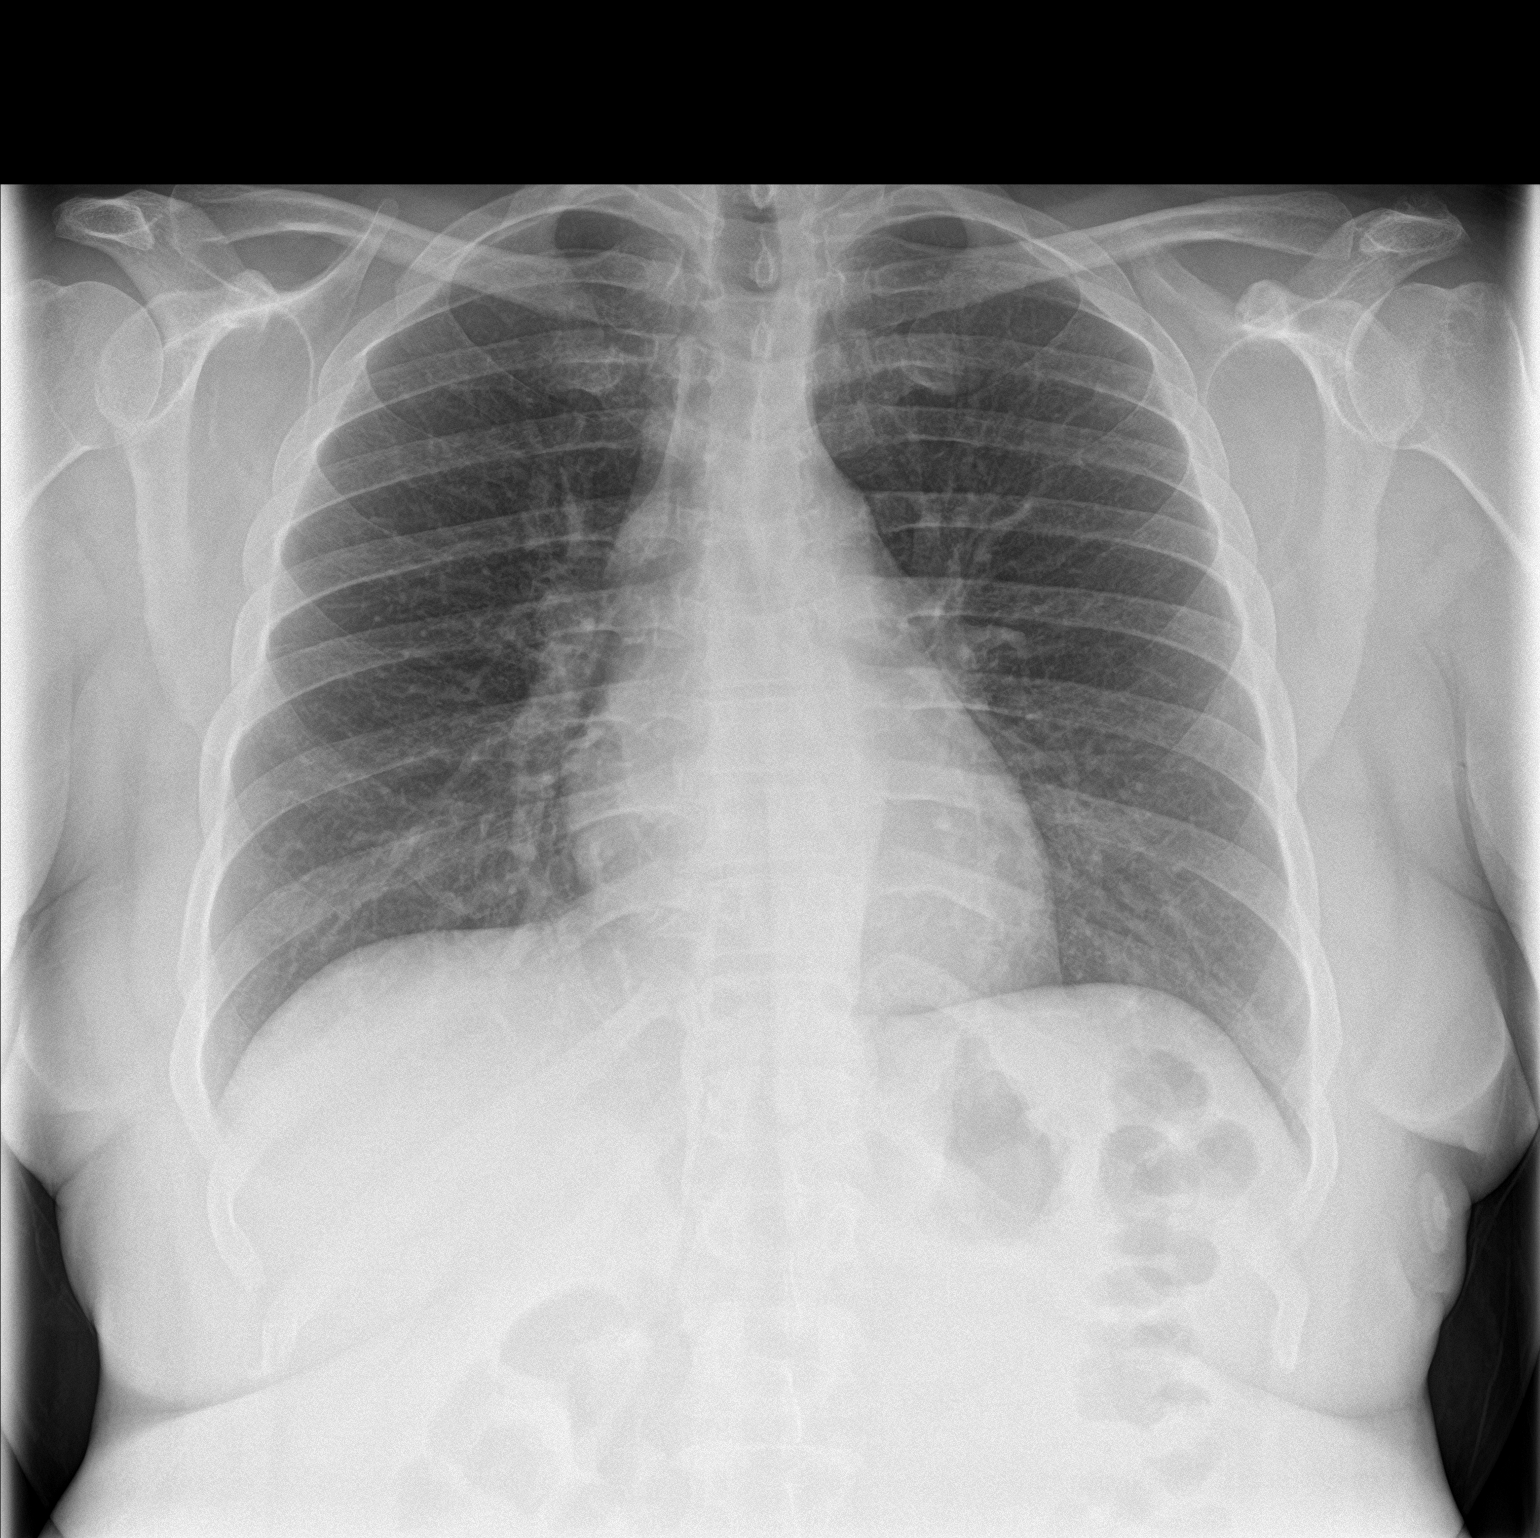

[chest lat]
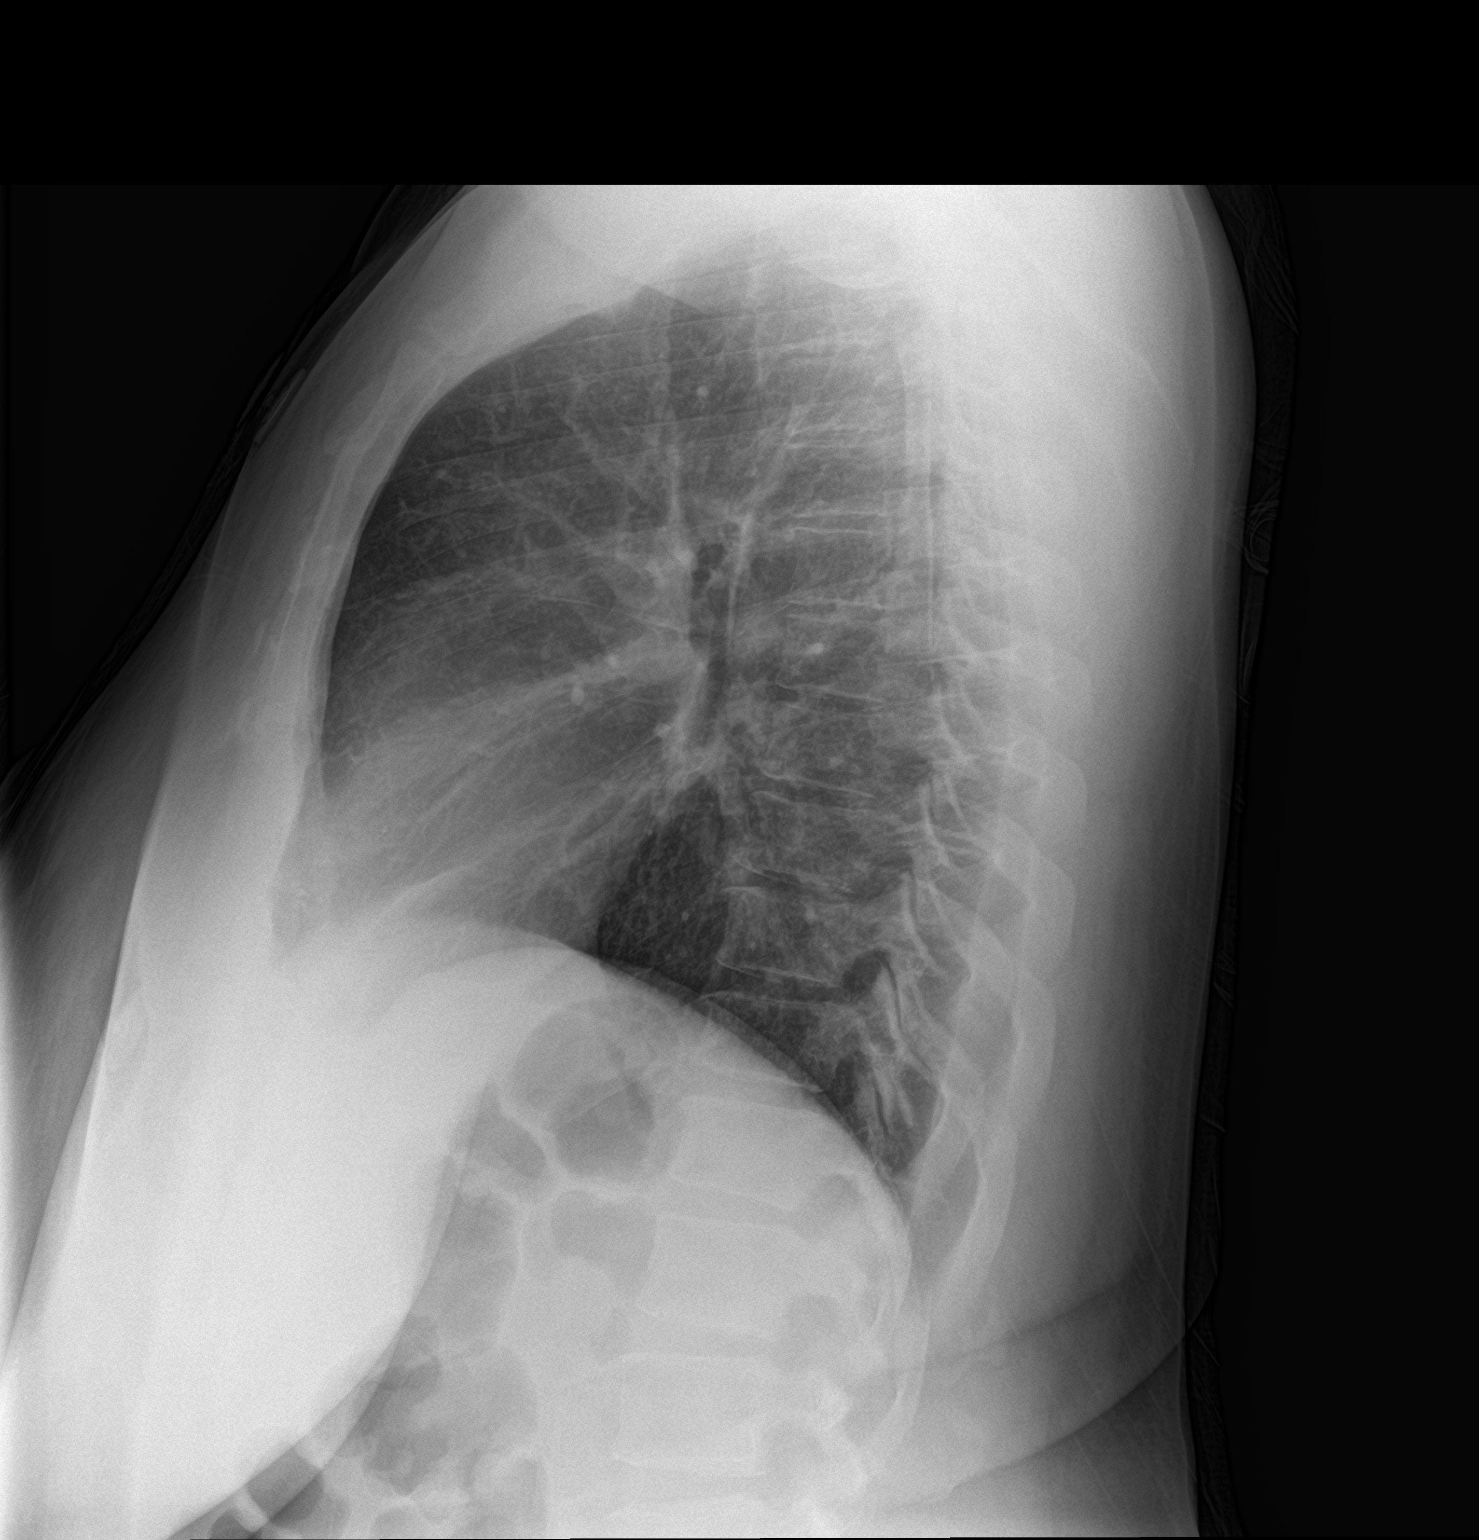

[2 of 2 positions shown; findings below may reference images not displayed]

FINDINGS: The heart size and mediastinal contours are within normal limits.
Both lungs are clear. The visualized skeletal structures are
unremarkable.
IMPRESSION: No active cardiopulmonary disease.

## 2021-07-21 ENCOUNTER — Other Ambulatory Visit: Payer: Self-pay

## 2021-07-21 DIAGNOSIS — R0602 Shortness of breath: Secondary | ICD-10-CM

## 2021-07-21 DIAGNOSIS — E785 Hyperlipidemia, unspecified: Secondary | ICD-10-CM

## 2021-07-21 MED ORDER — MONTELUKAST SODIUM 10 MG PO TABS: 10.0000 mg | ORAL_TABLET | Freq: Every day | ORAL | 11 refills | Status: AC

## 2021-07-21 MED ORDER — ATORVASTATIN CALCIUM 10 MG PO TABS: 10.0000 mg | ORAL_TABLET | Freq: Every day | ORAL | 3 refills | Status: AC

## 2021-07-28 ENCOUNTER — Other Ambulatory Visit: Payer: Self-pay

## 2021-07-28 MED ORDER — BUDESONIDE-FORMOTEROL FUMARATE 160-4.5 MCG/ACT IN AERO: 2.0000 | INHALATION_SPRAY | Freq: Two times a day (BID) | RESPIRATORY_TRACT | 11 refills | Status: AC

## 2021-09-06 ENCOUNTER — Ambulatory Visit: Payer: Medicare Other | Admitting: Nurse Practitioner

## 2021-10-06 ENCOUNTER — Ambulatory Visit: Payer: Medicare Other | Admitting: Nurse Practitioner

## 2022-08-18 ENCOUNTER — Other Ambulatory Visit: Payer: Self-pay | Admitting: Nurse Practitioner

## 2022-08-18 DIAGNOSIS — E785 Hyperlipidemia, unspecified: Secondary | ICD-10-CM

## 2022-09-13 ENCOUNTER — Telehealth: Payer: Self-pay

## 2022-09-13 ENCOUNTER — Ambulatory Visit (INDEPENDENT_AMBULATORY_CARE_PROVIDER_SITE_OTHER): Payer: Medicare Other

## 2022-09-13 VITALS — Ht 62.0 in | Wt 243.0 lb

## 2022-09-13 DIAGNOSIS — Z0000 Encounter for general adult medical examination without abnormal findings: Secondary | ICD-10-CM

## 2022-09-13 NOTE — Patient Instructions (Signed)
Sheri May , Thank you for taking time to come for your Medicare Wellness Visit. I appreciate your ongoing commitment to your health goals. Please review the following plan we discussed and let me know if I can assist you in the future.   These are the goals we discussed:  Goals      DIET - REDUCE FAT INTAKE     Quit Smoking        This is a list of the screening recommended for you and due dates:  Health Maintenance  Topic Date Due   DTaP/Tdap/Td vaccine (1 - Tdap) Never done   Colon Cancer Screening  Never done   COVID-19 Vaccine (3 - 2023-24 season) 05/18/2022   Pap Smear  11/19/2022   Medicare Annual Wellness Visit  09/14/2023   Flu Shot  Completed   Hepatitis C Screening: USPSTF Recommendation to screen - Ages 18-79 yo.  Completed   HIV Screening  Completed   HPV Vaccine  Aged Out    Advanced directives: Has paperwork will fill out and bring copy.  Conditions/risks identified: none  Next appointment: Follow up in one year for your annual wellness visit.   Preventive Care 40-64 Years, Female Preventive care refers to lifestyle choices and visits with your health care provider that can promote health and wellness. What does preventive care include? A yearly physical exam. This is also called an annual well check. Dental exams once or twice a year. Routine eye exams. Ask your health care provider how often you should have your eyes checked. Personal lifestyle choices, including: Daily care of your teeth and gums. Regular physical activity. Eating a healthy diet. Avoiding tobacco and drug use. Limiting alcohol use. Practicing safe sex. Taking low-dose aspirin daily starting at age 55. Taking vitamin and mineral supplements as recommended by your health care provider. What happens during an annual well check? The services and screenings done by your health care provider during your annual well check will depend on your age, overall health, lifestyle risk factors, and  family history of disease. Counseling  Your health care provider may ask you questions about your: Alcohol use. Tobacco use. Drug use. Emotional well-being. Home and relationship well-being. Sexual activity. Eating habits. Work and work Statistician. Method of birth control. Menstrual cycle. Pregnancy history. Screening  You may have the following tests or measurements: Height, weight, and BMI. Blood pressure. Lipid and cholesterol levels. These may be checked every 5 years, or more frequently if you are over 67 years old. Skin check. Lung cancer screening. You may have this screening every year starting at age 10 if you have a 30-pack-year history of smoking and currently smoke or have quit within the past 15 years. Fecal occult blood test (FOBT) of the stool. You may have this test every year starting at age 76. Flexible sigmoidoscopy or colonoscopy. You may have a sigmoidoscopy every 5 years or a colonoscopy every 10 years starting at age 63. Hepatitis C blood test. Hepatitis B blood test. Sexually transmitted disease (STD) testing. Diabetes screening. This is done by checking your blood sugar (glucose) after you have not eaten for a while (fasting). You may have this done every 1-3 years. Mammogram. This may be done every 1-2 years. Talk to your health care provider about when you should start having regular mammograms. This may depend on whether you have a family history of breast cancer. BRCA-related cancer screening. This may be done if you have a family history of breast, ovarian, tubal, or  peritoneal cancers. Pelvic exam and Pap test. This may be done every 3 years starting at age 36. Starting at age 49, this may be done every 5 years if you have a Pap test in combination with an HPV test. Bone density scan. This is done to screen for osteoporosis. You may have this scan if you are at high risk for osteoporosis. Discuss your test results, treatment options, and if necessary,  the need for more tests with your health care provider. Vaccines  Your health care provider may recommend certain vaccines, such as: Influenza vaccine. This is recommended every year. Tetanus, diphtheria, and acellular pertussis (Tdap, Td) vaccine. You may need a Td booster every 10 years. Zoster vaccine. You may need this after age 82. Pneumococcal 13-valent conjugate (PCV13) vaccine. You may need this if you have certain conditions and were not previously vaccinated. Pneumococcal polysaccharide (PPSV23) vaccine. You may need one or two doses if you smoke cigarettes or if you have certain conditions. Talk to your health care provider about which screenings and vaccines you need and how often you need them. This information is not intended to replace advice given to you by your health care provider. Make sure you discuss any questions you have with your health care provider. Document Released: 09/30/2015 Document Revised: 05/23/2016 Document Reviewed: 07/05/2015 Elsevier Interactive Patient Education  2017 [employer] Prevention in the Home Falls can cause injuries. They can happen to people of all ages. There are many things you can do to make your home safe and to help prevent falls. What can I do on the outside of my home? Regularly fix the edges of walkways and driveways and fix any cracks. Remove anything that might make you trip as you walk through a door, such as a raised step or threshold. Trim any bushes or trees on the path to your home. Use bright outdoor lighting. Clear any walking paths of anything that might make someone trip, such as rocks or tools. Regularly check to see if handrails are loose or broken. Make sure that both sides of any steps have handrails. Any raised decks and porches should have guardrails on the edges. Have any leaves, snow, or ice cleared regularly. Use sand or salt on walking paths during winter. Clean up any spills in your garage right  away. This includes oil or grease spills. What can I do in the bathroom? Use night lights. Install grab bars by the toilet and in the tub and shower. Do not use towel bars as grab bars. Use non-skid mats or decals in the tub or shower. If you need to sit down in the shower, use a plastic, non-slip stool. Keep the floor dry. Clean up any water that spills on the floor as soon as it happens. Remove soap buildup in the tub or shower regularly. Attach bath mats securely with double-sided non-slip rug tape. Do not have throw rugs and other things on the floor that can make you trip. What can I do in the bedroom? Use night lights. Make sure that you have a light by your bed that is easy to reach. Do not use any sheets or blankets that are too big for your bed. They should not hang down onto the floor. Have a firm chair that has side arms. You can use this for support while you get dressed. Do not have throw rugs and other things on the floor that can make you trip. What can I do in  the kitchen? Clean up any spills right away. Avoid walking on wet floors. Keep items that you use a lot in easy-to-reach places. If you need to reach something above you, use a strong step stool that has a grab bar. Keep electrical cords out of the way. Do not use floor polish or wax that makes floors slippery. If you must use wax, use non-skid floor wax. Do not have throw rugs and other things on the floor that can make you trip. What can I do with my stairs? Do not leave any items on the stairs. Make sure that there are handrails on both sides of the stairs and use them. Fix handrails that are broken or loose. Make sure that handrails are as long as the stairways. Check any carpeting to make sure that it is firmly attached to the stairs. Fix any carpet that is loose or worn. Avoid having throw rugs at the top or bottom of the stairs. If you do have throw rugs, attach them to the floor with carpet tape. Make sure  that you have a light switch at the top of the stairs and the bottom of the stairs. If you do not have them, ask someone to add them for you. What else can I do to help prevent falls? Wear shoes that: Do not have high heels. Have rubber bottoms. Are comfortable and fit you well. Are closed at the toe. Do not wear sandals. If you use a stepladder: Make sure that it is fully opened. Do not climb a closed stepladder. Make sure that both sides of the stepladder are locked into place. Ask someone to hold it for you, if possible. Clearly mark and make sure that you can see: Any grab bars or handrails. First and last steps. Where the edge of each step is. Use tools that help you move around (mobility aids) if they are needed. These include: Canes. Walkers. Scooters. Crutches. Turn on the lights when you go into a dark area. Replace any light bulbs as soon as they burn out. Set up your furniture so you have a clear path. Avoid moving your furniture around. If any of your floors are uneven, fix them. If there are any pets around you, be aware of where they are. Review your medicines with your doctor. Some medicines can make you feel dizzy. This can increase your chance of falling. Ask your doctor what other things that you can do to help prevent falls. This information is not intended to replace advice given to you by your health care provider. Make sure you discuss any questions you have with your health care provider. Document Released: 06/30/2009 Document Revised: 02/09/2016 Document Reviewed: 10/08/2014 Elsevier Interactive Patient Education  2017 [employer].

## 2022-09-13 NOTE — Telephone Encounter (Signed)
Patient is in need for referral or help for community resources. Please reach out to patient.

## 2022-09-13 NOTE — Progress Notes (Addendum)
I connected with  Jefm Bryant on 09/18/22 by a audio enabled telemedicine application and verified that I am speaking with the correct person using two identifiers.  Patient Location: Home  Provider Location: Home Office  I discussed the limitations of evaluation and management by telemedicine. The patient expressed understanding and agreed to proceed.  Subjective:   Sheri May is a 46 y.o. female who presents for Medicare Annual (Subsequent) preventive examination.  Review of Systems    Defer to pcp Cardiac Risk Factors include: dyslipidemia;sedentary lifestyle;obesity (BMI >30kg/m2);smoking/ tobacco exposure     Objective:    Today's Vitals   09/13/22 1034 09/13/22 1035  Weight: 243 lb (110.2 kg)   Height: 5\' 2"  (1.575 m)   PainSc:  5    Body mass index is 44.45 kg/m.     09/13/2022   10:49 AM 03/24/2021    9:40 AM 03/13/2021   10:33 AM  Advanced Directives  Does Patient Have a Medical Advance Directive? No No No  Would patient like information on creating a medical advance directive? No - Patient declined Yes (MAU/Ambulatory/Procedural Areas - Information given) No - Patient declined    Current Medications (verified) Outpatient Encounter Medications as of 09/13/2022  Medication Sig   albuterol (VENTOLIN HFA) 108 (90 Base) MCG/ACT inhaler Inhale 1-2 puffs into the lungs every 4 (four) hours as needed.   aspirin-acetaminophen-caffeine (EXCEDRIN MIGRAINE) 250-250-65 MG tablet Take 2 tablets by mouth every 6 (six) hours as needed for headache or migraine.   Atogepant (QULIPTA) 60 MG TABS Take by mouth.   atorvastatin (LIPITOR) 10 MG tablet Take 1 tablet (10 mg total) by mouth daily.   budesonide-formoterol (SYMBICORT) 160-4.5 MCG/ACT inhaler Inhale 2 puffs into the lungs 2 (two) times daily.   busPIRone (BUSPAR) 15 MG tablet Take 1 tablet (15 mg total) by mouth 2 (two) times daily.   Dupilumab (DUPIXENT) 300 MG/2ML SOPN Inject into the skin.   hydrOXYzine  (ATARAX/VISTARIL) 10 MG tablet Take 1 tablet (10 mg total) by mouth 3 (three) times daily as needed.   levalbuterol (XOPENEX HFA) 45 MCG/ACT inhaler Inhale 2 puffs into the lungs every 8 (eight) hours as needed for wheezing or shortness of breath.   levocetirizine (XYZAL) 5 MG tablet Take 5 mg by mouth every evening.   linaclotide (LINZESS) 290 MCG CAPS capsule Take 290 mcg by mouth daily before breakfast.   Menthol-Methyl Salicylate (SALONPAS PAIN RELIEF PATCH) PTCH Apply 1 patch topically 3 (three) times daily for 5000 doses.   mometasone (NASONEX) 50 MCG/ACT nasal spray Place 2 sprays into the nose daily.   montelukast (SINGULAIR) 10 MG tablet Take 1 tablet (10 mg total) by mouth at bedtime.   Multiple Vitamin (MULTIVITAMIN) capsule Take 1 capsule by mouth daily.   omeprazole (PRILOSEC) 20 MG capsule Take 20 mg by mouth daily.   folic acid (FOLVITE) 1 MG tablet Take 1 mg by mouth every morning. (Patient not taking: Reported on 09/13/2022)   [DISCONTINUED] cefdinir (OMNICEF) 300 MG capsule Take 300 mg by mouth 2 (two) times daily.   [DISCONTINUED] cetirizine (ZYRTEC) 10 MG tablet Take 1 tablet (10 mg total) by mouth daily. (Patient not taking: Reported on 03/24/2021)   [DISCONTINUED] cyclobenzaprine (FLEXERIL) 10 MG tablet Take 1 tablet (10 mg total) by mouth 2 (two) times daily as needed. (Patient not taking: Reported on 03/08/2021)   [DISCONTINUED] famotidine (PEPCID) 20 MG tablet TAKE 1 TABLET(20 MG) BY MOUTH DAILY (Patient not taking: Reported on 09/13/2022)   [DISCONTINUED] fluticasone (FLONASE) 50  MCG/ACT nasal spray Place 2 sprays into both nostrils daily.   [DISCONTINUED] methotrexate (RHEUMATREX) 2.5 MG tablet Take 17.5 mg by mouth every Sunday. 7 tablets - 17.5 mg   [DISCONTINUED] naproxen (NAPROSYN) 500 MG tablet TAKE 1 TABLET(500 MG) BY MOUTH TWICE DAILY AS NEEDED FOR PAIN (Patient not taking: Reported on 03/08/2021)   [DISCONTINUED] Olopatadine HCl 0.2 % SOLN Place 1-2 drops into both  eyes daily. (Patient not taking: Reported on 03/08/2021)   [DISCONTINUED] omalizumab Geoffry Paradise) 150 MG/ML prefilled syringe Inject 375 mg into the skin every 14 (fourteen) days.   [DISCONTINUED] predniSONE (DELTASONE) 20 MG tablet Take 20 mg by mouth daily with breakfast. 3 tablets by mouth daily x 3 days Take 2 tablets by mouth daily for 2 days Take 1 tablet by mouth daily for 2 days   [DISCONTINUED] Vitamin D, Ergocalciferol, (DRISDOL) 1.25 MG (50000 UNIT) CAPS capsule Take 1 capsule (50,000 Units total) by mouth every 7 (seven) days.   No facility-administered encounter medications on file as of 09/13/2022.    Allergies (verified) Erythromycin, Ofloxacin, and Ace inhibitors   History: Past Medical History:  Diagnosis Date   Anxiety 09/2019   Arthritis    Asthma    Bone spur of left foot    Fibromyalgia    GERD without esophagitis 07/2019   H/O scleroderma    History of uterine fibroid    removal 2013   Homelessness 09/2019   Hyperlipidemia 07/2019   Systemic sclerosis (HCC)    Vitamin D deficiency 07/2019   Past Surgical History:  Procedure Laterality Date   CARPAL TUNNEL RELEASE Bilateral    CESAREAN SECTION  1997   ENDOMETRIAL ABLATION  2003   KNEE SURGERY Left    plate in left knee   LAPAROSCOPIC SUPRACERVICAL HYSTERECTOMY  2012   Bleeding, pain   MYOMECTOMY     hysteroscopy, at time of ablation   NASAL SEPTUM SURGERY     TUBAL LIGATION     postpartum after VBAC   Family History  Problem Relation Age of Onset   Hypertension Mother    Fibromyalgia Mother    Hypertension Father    Multiple sclerosis Brother    Social History   Socioeconomic History   Marital status: Single    Spouse name: Not on file   Number of children: Not on file   Years of education: Not on file   Highest education level: Not on file  Occupational History   Not on file  Tobacco Use   Smoking status: Former    Types: Cigarettes   Smokeless tobacco: Never  Vaping Use   Vaping  Use: Never used  Substance and Sexual Activity   Alcohol use: Not Currently   Drug use: Not Currently   Sexual activity: Not Currently    Partners: Male    Birth control/protection: Condom  Other Topics Concern   Not on file  Social History Narrative   Not on file   Social Determinants of Health   Financial Resource Strain: High Risk (09/13/2022)   Overall Financial Resource Strain (CARDIA)    Difficulty of Paying Living Expenses: Very hard  Food Insecurity: Food Insecurity Present (09/13/2022)   Hunger Vital Sign    Worried About Running Out of Food in the Last Year: Often true    Ran Out of Food in the Last Year: Often true  Transportation Needs: No Transportation Needs (09/13/2022)   PRAPARE - Transportation    Lack of Transportation (Medical): No    Lack  of Transportation (Non-Medical): No  Physical Activity: Insufficiently Active (09/13/2022)   Exercise Vital Sign    Days of Exercise per Week: 4 days    Minutes of Exercise per Session: 20 min  Stress: Stress Concern Present (09/13/2022)   [employer] of Occupational Health - Occupational Stress Questionnaire    Feeling of Stress : Rather much  Social Connections: Socially Isolated (09/13/2022)   Social Connection and Isolation Panel [NHANES]    Frequency of Communication with Friends and Family: More than three times a week    Frequency of Social Gatherings with Friends and Family: Never    Attends Religious Services: Never    Database administrator or Organizations: No    Attends Engineer, structural: Never    Marital Status: Never married    Tobacco Counseling Counseling given: Not Answered   Clinical Intake:  Pre-visit preparation completed: Yes  Pain : 0-10 Pain Score: 5  Pain Type: Chronic pain Pain Location: Wrist Pain Descriptors / Indicators: Aching, Dull Pain Onset: More than a month ago Pain Frequency: Intermittent     Nutritional Risks: None Diabetes: No  How often do you  need to have someone help you when you read instructions, pamphlets, or other written materials from your doctor or pharmacy?: 1 - Never What is the last grade level you completed in school?: 12-some college  Diabetic?no  Interpreter Needed?: No  Information entered by :: nikki m, cma   Activities of Daily Living    09/13/2022   10:50 AM 09/11/2022    2:09 PM  In your present state of health, do you have any difficulty performing the following activities:  Hearing? 0 1  Vision? 0 1  Difficulty concentrating or making decisions? 0 0  Walking or climbing stairs? 0 1  Dressing or bathing? 0 0  Doing errands, shopping? 0 0  Preparing Food and eating ? N N  Using the Toilet? N N  In the past six months, have you accidently leaked urine? N N  Do you have problems with loss of bowel control? N N  Managing your Medications? N N  Managing your Finances? N Y  Housekeeping or managing your Housekeeping? N     Patient Care Team: Barbette Merino, NP as PCP - General (Adult Health Nurse Practitioner)  Indicate any recent Medical Services you may have received from other than [facility] providers in the past year (date may be approximate).     Assessment:   This is a routine wellness examination for [place].  Hearing/Vision screen No results found.  Dietary issues and exercise activities discussed: Current Exercise Habits: Home exercise routine, Type of exercise: walking, Time (Minutes): 20, Frequency (Times/Week): 4, Weekly Exercise (Minutes/Week): 80, Intensity: Mild, Exercise limited by: orthopedic condition(s)   Goals Addressed   None   Depression Screen    09/13/2022   10:48 AM 03/13/2021   10:20 AM 03/08/2021   11:00 AM 11/19/2019   11:05 AM  PHQ 2/9 Scores  PHQ - 2 Score 0 1 2 1   PHQ- 9 Score   4 5    Fall Risk    09/13/2022   10:50 AM 09/11/2022    2:09 PM 03/13/2021   10:34 AM 03/08/2021   11:00 AM 01/01/2020    2:24 PM  Fall Risk   Falls in the past year? 1 1 1  0 0   Number falls in past yr: 0 0 1 0 0  Injury with Fall? 0 0 0 0  0  Risk for fall due to : History of fall(s)  Impaired mobility    Follow up Falls evaluation completed;Education provided;Falls prevention discussed  Education provided      FALL RISK PREVENTION PERTAINING TO THE HOME:  Any stairs in or around the home? No  If so, are there any without handrails? No  Home free of loose throw rugs in walkways, pet beds, electrical cords, etc? No  Adequate lighting in your home to reduce risk of falls? Yes   ASSISTIVE DEVICES UTILIZED TO PREVENT FALLS:  Life alert? No  Use of a cane, walker or w/c? No  Grab bars in the bathroom? No  Shower chair or bench in shower? No  Elevated toilet seat or a handicapped toilet? No   TIMED UP AND GO:  Was the test performed? No .  Length of time to ambulate 10 feet: n/a sec.     Cognitive Function:    03/13/2021   10:36 AM  MMSE - Mini Mental State Exam  Orientation to time 5  Orientation to Place 5  Registration 3  Attention/ Calculation 5  Recall 3  Language- name 2 objects 2  Language- repeat 1  Language- follow 3 step command 3  Language- read & follow direction 1  Write a sentence 1  Copy design 1  Total score 30        09/13/2022   10:51 AM  6CIT Screen  What Year? 0 points  What month? 0 points  What time? 0 points  Count back from 20 0 points  Months in reverse 0 points  Repeat phrase 2 points  Total Score 2 points    Immunizations Immunization History  Administered Date(s) Administered   Influenza,inj,Quad PF,6+ Mos 08/03/2019    TDAP status: Due, Education has been provided regarding the importance of this vaccine. Advised may receive this vaccine at local pharmacy or Health Dept. Aware to provide a copy of the vaccination record if obtained from local pharmacy or Health Dept. Verbalized acceptance and understanding.  Flu Vaccine status: Up to date  Pneumococcal vaccine status: Up to date  Covid-19 vaccine  status: Information provided on how to obtain vaccines.   Qualifies for Shingles Vaccine? No   Zostavax completed No   Shingrix Completed?: No.    Education has been provided regarding the importance of this vaccine. Patient has been advised to call insurance company to determine out of pocket expense if they have not yet received this vaccine. Advised may also receive vaccine at local pharmacy or Health Dept. Verbalized acceptance and understanding.  Screening Tests Health Maintenance  Topic Date Due   DTaP/Tdap/Td (1 - Tdap) Never done   COLONOSCOPY (Pts 45-89yrs Insurance coverage will need to be confirmed)  Never done   COVID-19 Vaccine (3 - 2023-24 season) 05/18/2022   PAP SMEAR-Modifier  11/19/2022   Medicare Annual Wellness (AWV)  09/14/2023   INFLUENZA VACCINE  Completed   Hepatitis C Screening  Completed   HIV Screening  Completed   HPV VACCINES  Aged Out    Health Maintenance  Health Maintenance Due  Topic Date Due   DTaP/Tdap/Td (1 - Tdap) Never done   COLONOSCOPY (Pts 45-72yrs Insurance coverage will need to be confirmed)  Never done   COVID-19 Vaccine (3 - 2023-24 season) 05/18/2022    Colorectal cancer screening: Type of screening: Colonoscopy. Completed 04/2022. Repeat every 5 years  Mammogram status: Completed 09/2020. Repeat every year    LAdditional Screening:  Hepatitis C Screening:  does qualify; Completed 07/26/20  Vision Screening: Recommended annual ophthalmology exams for early detection of glaucoma and other disorders of the eye. Is the patient up to date with their annual eye exam?  Yes  Who is the provider or what is the name of the office in which the patient attends annual eye exams? Dr Reece AgarG If pt is not established with a provider, would they like to be referred to a provider to establish care? No .   Dental Screening: Recommended annual dental exams for proper oral hygiene  Community Resource Referral / Chronic Care Management: CRR required this  visit?  Yes   CCM required this visit?  No      Plan:     I have personally reviewed and noted the following in the patient's chart:   Medical and social history Use of alcohol, tobacco or illicit drugs  Current medications and supplements including opioid prescriptions. Patient is not currently taking opioid prescriptions. Functional ability and status Nutritional status Physical activity Advanced directives List of other physicians Hospitalizations, surgeries, and ER visits in previous 12 months Vitals Screenings to include cognitive, depression, and falls Referrals and appointments  In addition, I have reviewed and discussed with patient certain preventive protocols, quality metrics, and best practice recommendations. A written personalized care plan for preventive services as well as general preventive health recommendations were provided to patient.     Jama FlavorsArielle N Kosei Rhodes, CMA   09/13/2022   Nurse Notes: Non face to face minutes spent with patient 34. Patient is working on a new rheumatologist to figure out medication regimine. She would also like referral for community resources. I will message provider.
